# Patient Record
Sex: Female | Born: 1945
Health system: Southern US, Community
[De-identification: ages and names within clinical notes are randomized; demographics above are authoritative.]

## PROBLEM LIST (undated history)

## (undated) DIAGNOSIS — I10 Essential (primary) hypertension: Secondary | ICD-10-CM

## (undated) DIAGNOSIS — F419 Anxiety disorder, unspecified: Secondary | ICD-10-CM

## (undated) DIAGNOSIS — M1990 Unspecified osteoarthritis, unspecified site: Secondary | ICD-10-CM

## (undated) HISTORY — DX: Essential (primary) hypertension: I10

## (undated) HISTORY — DX: Unspecified osteoarthritis, unspecified site: M19.90

## (undated) HISTORY — DX: Anxiety disorder, unspecified: F41.9

---

## 1999-07-18 ENCOUNTER — Ambulatory Visit (HOSPITAL_BASED_OUTPATIENT_CLINIC_OR_DEPARTMENT_OTHER): Admission: RE | Admit: 1999-07-18 | Discharge: 1999-07-18 | Payer: Self-pay | Admitting: Orthopedic Surgery

## 1999-09-14 ENCOUNTER — Encounter: Admission: RE | Admit: 1999-09-14 | Discharge: 1999-09-14 | Payer: Self-pay | Admitting: *Deleted

## 1999-09-14 ENCOUNTER — Encounter: Payer: Self-pay | Admitting: *Deleted

## 2004-04-28 ENCOUNTER — Ambulatory Visit (HOSPITAL_COMMUNITY): Admission: RE | Admit: 2004-04-28 | Discharge: 2004-04-28 | Payer: Self-pay | Admitting: Gastroenterology

## 2007-06-11 ENCOUNTER — Encounter: Admission: RE | Admit: 2007-06-11 | Discharge: 2007-06-11 | Payer: Self-pay | Admitting: Family Medicine

## 2010-09-13 ENCOUNTER — Ambulatory Visit: Payer: Self-pay | Admitting: Oncology

## 2010-09-29 LAB — CBC WITH DIFFERENTIAL/PLATELET
BASO%: 1 % (ref 0.0–2.0)
Basophils Absolute: 0.1 10*3/uL (ref 0.0–0.1)
EOS%: 1 % (ref 0.0–7.0)
Eosinophils Absolute: 0.1 10*3/uL (ref 0.0–0.5)
HCT: 49.2 % — ABNORMAL HIGH (ref 34.8–46.6)
HGB: 16.3 g/dL — ABNORMAL HIGH (ref 11.6–15.9)
LYMPH%: 35.7 % (ref 14.0–49.7)
MCH: 29.9 pg (ref 25.1–34.0)
MCHC: 33.3 g/dL (ref 31.5–36.0)
MCV: 89.9 fL (ref 79.5–101.0)
MONO#: 0.6 10*3/uL (ref 0.1–0.9)
MONO%: 9.4 % (ref 0.0–14.0)
NEUT#: 3.1 10*3/uL (ref 1.5–6.5)
NEUT%: 52.9 % (ref 38.4–76.8)
Platelets: 220 10*3/uL (ref 145–400)
RBC: 5.46 10*6/uL — ABNORMAL HIGH (ref 3.70–5.45)
RDW: 13.1 % (ref 11.2–14.5)
WBC: 5.9 10*3/uL (ref 3.9–10.3)
lymph#: 2.1 10*3/uL (ref 0.9–3.3)

## 2010-09-29 LAB — CHCC SMEAR

## 2010-10-02 LAB — COMPREHENSIVE METABOLIC PANEL
ALT: 20 U/L (ref 0–35)
AST: 27 U/L (ref 0–37)
Albumin: 4.6 g/dL (ref 3.5–5.2)
Alkaline Phosphatase: 87 U/L (ref 39–117)
BUN: 13 mg/dL (ref 6–23)
CO2: 28 mEq/L (ref 19–32)
Calcium: 10.4 mg/dL (ref 8.4–10.5)
Chloride: 104 mEq/L (ref 96–112)
Creatinine, Ser: 0.75 mg/dL (ref 0.40–1.20)
Glucose, Bld: 94 mg/dL (ref 70–99)
Potassium: 4.4 mEq/L (ref 3.5–5.3)
Sodium: 143 mEq/L (ref 135–145)
Total Bilirubin: 1.1 mg/dL (ref 0.3–1.2)
Total Protein: 7.2 g/dL (ref 6.0–8.3)

## 2010-10-02 LAB — ERYTHROPOIETIN: Erythropoietin: 11.1 m[IU]/mL (ref 2.6–34.0)

## 2010-10-03 LAB — JAK-2 V617F

## 2010-11-05 ENCOUNTER — Encounter: Payer: Self-pay | Admitting: Family Medicine

## 2010-11-06 ENCOUNTER — Ambulatory Visit: Payer: Self-pay | Admitting: Oncology

## 2010-11-08 LAB — CBC WITH DIFFERENTIAL/PLATELET
BASO%: 1.1 % (ref 0.0–2.0)
Basophils Absolute: 0.1 10*3/uL (ref 0.0–0.1)
EOS%: 2.9 % (ref 0.0–7.0)
Eosinophils Absolute: 0.1 10*3/uL (ref 0.0–0.5)
HCT: 45.5 % (ref 34.8–46.6)
HGB: 15.5 g/dL (ref 11.6–15.9)
LYMPH%: 35 % (ref 14.0–49.7)
MCH: 30.3 pg (ref 25.1–34.0)
MCHC: 34.1 g/dL (ref 31.5–36.0)
MCV: 88.8 fL (ref 79.5–101.0)
MONO#: 0.6 10*3/uL (ref 0.1–0.9)
MONO%: 10.8 % (ref 0.0–14.0)
NEUT#: 2.6 10*3/uL (ref 1.5–6.5)
NEUT%: 50.2 % (ref 38.4–76.8)
Platelets: 182 10*3/uL (ref 145–400)
RBC: 5.12 10*6/uL (ref 3.70–5.45)
RDW: 13.3 % (ref 11.2–14.5)
WBC: 5.2 10*3/uL (ref 3.9–10.3)
lymph#: 1.8 10*3/uL (ref 0.9–3.3)

## 2011-05-01 ENCOUNTER — Other Ambulatory Visit: Payer: Self-pay | Admitting: Oncology

## 2011-05-01 ENCOUNTER — Encounter (HOSPITAL_BASED_OUTPATIENT_CLINIC_OR_DEPARTMENT_OTHER): Payer: BC Managed Care – PPO | Admitting: Oncology

## 2011-05-01 DIAGNOSIS — D751 Secondary polycythemia: Secondary | ICD-10-CM

## 2011-05-01 DIAGNOSIS — Z7982 Long term (current) use of aspirin: Secondary | ICD-10-CM

## 2011-05-01 LAB — CBC WITH DIFFERENTIAL/PLATELET
Eosinophils Absolute: 0.2 10*3/uL (ref 0.0–0.5)
MONO#: 0.6 10*3/uL (ref 0.1–0.9)
NEUT#: 3 10*3/uL (ref 1.5–6.5)
Platelets: 195 10*3/uL (ref 145–400)
RBC: 5.08 10*6/uL (ref 3.70–5.45)
RDW: 13.2 % (ref 11.2–14.5)
WBC: 6 10*3/uL (ref 3.9–10.3)

## 2012-05-26 DIAGNOSIS — M2020 Hallux rigidus, unspecified foot: Secondary | ICD-10-CM | POA: Diagnosis not present

## 2012-05-28 DIAGNOSIS — R7989 Other specified abnormal findings of blood chemistry: Secondary | ICD-10-CM | POA: Diagnosis not present

## 2012-05-29 DIAGNOSIS — H43819 Vitreous degeneration, unspecified eye: Secondary | ICD-10-CM | POA: Diagnosis not present

## 2012-05-29 DIAGNOSIS — R7989 Other specified abnormal findings of blood chemistry: Secondary | ICD-10-CM | POA: Diagnosis not present

## 2012-05-29 DIAGNOSIS — H52209 Unspecified astigmatism, unspecified eye: Secondary | ICD-10-CM | POA: Diagnosis not present

## 2012-05-29 DIAGNOSIS — H2640 Unspecified secondary cataract: Secondary | ICD-10-CM | POA: Diagnosis not present

## 2012-05-29 DIAGNOSIS — Z961 Presence of intraocular lens: Secondary | ICD-10-CM | POA: Diagnosis not present

## 2012-06-06 DIAGNOSIS — L918 Other hypertrophic disorders of the skin: Secondary | ICD-10-CM | POA: Diagnosis not present

## 2012-06-06 DIAGNOSIS — T8489XA Other specified complication of internal orthopedic prosthetic devices, implants and grafts, initial encounter: Secondary | ICD-10-CM | POA: Diagnosis not present

## 2012-06-06 DIAGNOSIS — M25579 Pain in unspecified ankle and joints of unspecified foot: Secondary | ICD-10-CM | POA: Diagnosis not present

## 2012-06-06 DIAGNOSIS — Z472 Encounter for removal of internal fixation device: Secondary | ICD-10-CM | POA: Diagnosis not present

## 2012-06-06 DIAGNOSIS — M2020 Hallux rigidus, unspecified foot: Secondary | ICD-10-CM | POA: Diagnosis not present

## 2012-06-06 DIAGNOSIS — M795 Residual foreign body in soft tissue: Secondary | ICD-10-CM | POA: Diagnosis not present

## 2012-06-26 DIAGNOSIS — H2640 Unspecified secondary cataract: Secondary | ICD-10-CM | POA: Diagnosis not present

## 2012-06-26 DIAGNOSIS — H26499 Other secondary cataract, unspecified eye: Secondary | ICD-10-CM | POA: Diagnosis not present

## 2012-06-30 DIAGNOSIS — M2020 Hallux rigidus, unspecified foot: Secondary | ICD-10-CM | POA: Diagnosis not present

## 2012-07-03 DIAGNOSIS — M2020 Hallux rigidus, unspecified foot: Secondary | ICD-10-CM | POA: Diagnosis not present

## 2012-07-08 DIAGNOSIS — M2020 Hallux rigidus, unspecified foot: Secondary | ICD-10-CM | POA: Diagnosis not present

## 2012-07-11 DIAGNOSIS — M2020 Hallux rigidus, unspecified foot: Secondary | ICD-10-CM | POA: Diagnosis not present

## 2012-07-15 DIAGNOSIS — M2020 Hallux rigidus, unspecified foot: Secondary | ICD-10-CM | POA: Diagnosis not present

## 2012-07-17 DIAGNOSIS — M2020 Hallux rigidus, unspecified foot: Secondary | ICD-10-CM | POA: Diagnosis not present

## 2012-07-25 DIAGNOSIS — Z23 Encounter for immunization: Secondary | ICD-10-CM | POA: Diagnosis not present

## 2012-09-02 DIAGNOSIS — Z1231 Encounter for screening mammogram for malignant neoplasm of breast: Secondary | ICD-10-CM | POA: Diagnosis not present

## 2012-09-03 DIAGNOSIS — R05 Cough: Secondary | ICD-10-CM | POA: Diagnosis not present

## 2012-09-03 DIAGNOSIS — H6120 Impacted cerumen, unspecified ear: Secondary | ICD-10-CM | POA: Diagnosis not present

## 2012-09-03 DIAGNOSIS — J309 Allergic rhinitis, unspecified: Secondary | ICD-10-CM | POA: Diagnosis not present

## 2012-09-18 DIAGNOSIS — Z1331 Encounter for screening for depression: Secondary | ICD-10-CM | POA: Diagnosis not present

## 2012-09-18 DIAGNOSIS — J309 Allergic rhinitis, unspecified: Secondary | ICD-10-CM | POA: Diagnosis not present

## 2012-09-18 DIAGNOSIS — E039 Hypothyroidism, unspecified: Secondary | ICD-10-CM | POA: Diagnosis not present

## 2012-09-18 DIAGNOSIS — Z803 Family history of malignant neoplasm of breast: Secondary | ICD-10-CM | POA: Diagnosis not present

## 2012-09-18 DIAGNOSIS — E7800 Pure hypercholesterolemia, unspecified: Secondary | ICD-10-CM | POA: Diagnosis not present

## 2012-09-18 DIAGNOSIS — F329 Major depressive disorder, single episode, unspecified: Secondary | ICD-10-CM | POA: Diagnosis not present

## 2012-09-18 DIAGNOSIS — I10 Essential (primary) hypertension: Secondary | ICD-10-CM | POA: Diagnosis not present

## 2012-09-18 DIAGNOSIS — Z0000 Encounter for general adult medical examination without abnormal findings: Secondary | ICD-10-CM | POA: Diagnosis not present

## 2013-06-03 DIAGNOSIS — H04129 Dry eye syndrome of unspecified lacrimal gland: Secondary | ICD-10-CM | POA: Diagnosis not present

## 2013-06-03 DIAGNOSIS — Z961 Presence of intraocular lens: Secondary | ICD-10-CM | POA: Diagnosis not present

## 2013-06-03 DIAGNOSIS — H43819 Vitreous degeneration, unspecified eye: Secondary | ICD-10-CM | POA: Diagnosis not present

## 2013-06-03 DIAGNOSIS — H2640 Unspecified secondary cataract: Secondary | ICD-10-CM | POA: Diagnosis not present

## 2013-08-06 DIAGNOSIS — Z23 Encounter for immunization: Secondary | ICD-10-CM | POA: Diagnosis not present

## 2013-09-08 DIAGNOSIS — Z1231 Encounter for screening mammogram for malignant neoplasm of breast: Secondary | ICD-10-CM | POA: Diagnosis not present

## 2013-10-01 DIAGNOSIS — J309 Allergic rhinitis, unspecified: Secondary | ICD-10-CM | POA: Diagnosis not present

## 2013-10-01 DIAGNOSIS — F329 Major depressive disorder, single episode, unspecified: Secondary | ICD-10-CM | POA: Diagnosis not present

## 2013-10-01 DIAGNOSIS — Z1331 Encounter for screening for depression: Secondary | ICD-10-CM | POA: Diagnosis not present

## 2013-10-01 DIAGNOSIS — E7800 Pure hypercholesterolemia, unspecified: Secondary | ICD-10-CM | POA: Diagnosis not present

## 2013-10-01 DIAGNOSIS — I10 Essential (primary) hypertension: Secondary | ICD-10-CM | POA: Diagnosis not present

## 2013-10-01 DIAGNOSIS — Z0000 Encounter for general adult medical examination without abnormal findings: Secondary | ICD-10-CM | POA: Diagnosis not present

## 2013-10-01 DIAGNOSIS — E039 Hypothyroidism, unspecified: Secondary | ICD-10-CM | POA: Diagnosis not present

## 2013-10-01 DIAGNOSIS — M2550 Pain in unspecified joint: Secondary | ICD-10-CM | POA: Diagnosis not present

## 2014-07-01 DIAGNOSIS — Z23 Encounter for immunization: Secondary | ICD-10-CM | POA: Diagnosis not present

## 2014-07-19 DIAGNOSIS — Z1211 Encounter for screening for malignant neoplasm of colon: Secondary | ICD-10-CM | POA: Diagnosis not present

## 2014-07-19 DIAGNOSIS — D120 Benign neoplasm of cecum: Secondary | ICD-10-CM | POA: Diagnosis not present

## 2014-07-19 DIAGNOSIS — K598 Other specified functional intestinal disorders: Secondary | ICD-10-CM | POA: Diagnosis not present

## 2014-07-19 DIAGNOSIS — K6289 Other specified diseases of anus and rectum: Secondary | ICD-10-CM | POA: Diagnosis not present

## 2014-07-23 DIAGNOSIS — H26491 Other secondary cataract, right eye: Secondary | ICD-10-CM | POA: Diagnosis not present

## 2014-07-23 DIAGNOSIS — Z961 Presence of intraocular lens: Secondary | ICD-10-CM | POA: Diagnosis not present

## 2014-07-23 DIAGNOSIS — H04123 Dry eye syndrome of bilateral lacrimal glands: Secondary | ICD-10-CM | POA: Diagnosis not present

## 2014-07-23 DIAGNOSIS — H43813 Vitreous degeneration, bilateral: Secondary | ICD-10-CM | POA: Diagnosis not present

## 2014-08-18 DIAGNOSIS — H6120 Impacted cerumen, unspecified ear: Secondary | ICD-10-CM | POA: Diagnosis not present

## 2014-08-18 DIAGNOSIS — J309 Allergic rhinitis, unspecified: Secondary | ICD-10-CM | POA: Diagnosis not present

## 2014-09-22 DIAGNOSIS — Z1231 Encounter for screening mammogram for malignant neoplasm of breast: Secondary | ICD-10-CM | POA: Diagnosis not present

## 2014-09-22 DIAGNOSIS — Z803 Family history of malignant neoplasm of breast: Secondary | ICD-10-CM | POA: Diagnosis not present

## 2014-10-22 DIAGNOSIS — Z13820 Encounter for screening for osteoporosis: Secondary | ICD-10-CM | POA: Diagnosis not present

## 2014-10-22 DIAGNOSIS — Z0000 Encounter for general adult medical examination without abnormal findings: Secondary | ICD-10-CM | POA: Diagnosis not present

## 2014-10-22 DIAGNOSIS — E039 Hypothyroidism, unspecified: Secondary | ICD-10-CM | POA: Diagnosis not present

## 2014-10-22 DIAGNOSIS — R8290 Unspecified abnormal findings in urine: Secondary | ICD-10-CM | POA: Diagnosis not present

## 2014-10-22 DIAGNOSIS — E780 Pure hypercholesterolemia: Secondary | ICD-10-CM | POA: Diagnosis not present

## 2014-10-22 DIAGNOSIS — Z23 Encounter for immunization: Secondary | ICD-10-CM | POA: Diagnosis not present

## 2014-10-22 DIAGNOSIS — F329 Major depressive disorder, single episode, unspecified: Secondary | ICD-10-CM | POA: Diagnosis not present

## 2014-10-22 DIAGNOSIS — J309 Allergic rhinitis, unspecified: Secondary | ICD-10-CM | POA: Diagnosis not present

## 2014-10-22 DIAGNOSIS — I10 Essential (primary) hypertension: Secondary | ICD-10-CM | POA: Diagnosis not present

## 2014-10-22 DIAGNOSIS — Z803 Family history of malignant neoplasm of breast: Secondary | ICD-10-CM | POA: Diagnosis not present

## 2014-12-01 DIAGNOSIS — M8580 Other specified disorders of bone density and structure, unspecified site: Secondary | ICD-10-CM | POA: Diagnosis not present

## 2014-12-01 DIAGNOSIS — M859 Disorder of bone density and structure, unspecified: Secondary | ICD-10-CM | POA: Diagnosis not present

## 2014-12-06 DIAGNOSIS — I10 Essential (primary) hypertension: Secondary | ICD-10-CM | POA: Diagnosis not present

## 2014-12-06 DIAGNOSIS — E780 Pure hypercholesterolemia: Secondary | ICD-10-CM | POA: Diagnosis not present

## 2014-12-06 DIAGNOSIS — Z13820 Encounter for screening for osteoporosis: Secondary | ICD-10-CM | POA: Diagnosis not present

## 2014-12-06 DIAGNOSIS — D582 Other hemoglobinopathies: Secondary | ICD-10-CM | POA: Diagnosis not present

## 2014-12-06 DIAGNOSIS — R8290 Unspecified abnormal findings in urine: Secondary | ICD-10-CM | POA: Diagnosis not present

## 2014-12-06 DIAGNOSIS — Z23 Encounter for immunization: Secondary | ICD-10-CM | POA: Diagnosis not present

## 2014-12-06 DIAGNOSIS — F329 Major depressive disorder, single episode, unspecified: Secondary | ICD-10-CM | POA: Diagnosis not present

## 2014-12-06 DIAGNOSIS — Z803 Family history of malignant neoplasm of breast: Secondary | ICD-10-CM | POA: Diagnosis not present

## 2014-12-06 DIAGNOSIS — E039 Hypothyroidism, unspecified: Secondary | ICD-10-CM | POA: Diagnosis not present

## 2014-12-06 DIAGNOSIS — J309 Allergic rhinitis, unspecified: Secondary | ICD-10-CM | POA: Diagnosis not present

## 2014-12-06 DIAGNOSIS — Z0000 Encounter for general adult medical examination without abnormal findings: Secondary | ICD-10-CM | POA: Diagnosis not present

## 2015-07-11 DIAGNOSIS — Z23 Encounter for immunization: Secondary | ICD-10-CM | POA: Diagnosis not present

## 2015-07-18 DIAGNOSIS — M1712 Unilateral primary osteoarthritis, left knee: Secondary | ICD-10-CM | POA: Diagnosis not present

## 2015-07-25 DIAGNOSIS — M1712 Unilateral primary osteoarthritis, left knee: Secondary | ICD-10-CM | POA: Diagnosis not present

## 2015-07-28 DIAGNOSIS — H26491 Other secondary cataract, right eye: Secondary | ICD-10-CM | POA: Diagnosis not present

## 2015-07-28 DIAGNOSIS — H43813 Vitreous degeneration, bilateral: Secondary | ICD-10-CM | POA: Diagnosis not present

## 2015-07-28 DIAGNOSIS — H52203 Unspecified astigmatism, bilateral: Secondary | ICD-10-CM | POA: Diagnosis not present

## 2015-07-28 DIAGNOSIS — Z961 Presence of intraocular lens: Secondary | ICD-10-CM | POA: Diagnosis not present

## 2015-08-01 DIAGNOSIS — M2342 Loose body in knee, left knee: Secondary | ICD-10-CM | POA: Diagnosis not present

## 2015-08-01 DIAGNOSIS — S83272A Complex tear of lateral meniscus, current injury, left knee, initial encounter: Secondary | ICD-10-CM | POA: Diagnosis not present

## 2015-08-01 DIAGNOSIS — M1712 Unilateral primary osteoarthritis, left knee: Secondary | ICD-10-CM | POA: Diagnosis not present

## 2015-10-12 DIAGNOSIS — Z803 Family history of malignant neoplasm of breast: Secondary | ICD-10-CM | POA: Diagnosis not present

## 2015-10-12 DIAGNOSIS — Z1231 Encounter for screening mammogram for malignant neoplasm of breast: Secondary | ICD-10-CM | POA: Diagnosis not present

## 2015-11-02 DIAGNOSIS — Z0000 Encounter for general adult medical examination without abnormal findings: Secondary | ICD-10-CM | POA: Diagnosis not present

## 2015-11-02 DIAGNOSIS — M8580 Other specified disorders of bone density and structure, unspecified site: Secondary | ICD-10-CM | POA: Diagnosis not present

## 2015-11-02 DIAGNOSIS — M899 Disorder of bone, unspecified: Secondary | ICD-10-CM | POA: Diagnosis not present

## 2015-11-02 DIAGNOSIS — F338 Other recurrent depressive disorders: Secondary | ICD-10-CM | POA: Diagnosis not present

## 2015-11-02 DIAGNOSIS — E7800 Pure hypercholesterolemia, unspecified: Secondary | ICD-10-CM | POA: Diagnosis not present

## 2015-11-02 DIAGNOSIS — Z803 Family history of malignant neoplasm of breast: Secondary | ICD-10-CM | POA: Diagnosis not present

## 2015-11-02 DIAGNOSIS — E039 Hypothyroidism, unspecified: Secondary | ICD-10-CM | POA: Diagnosis not present

## 2015-11-02 DIAGNOSIS — H6120 Impacted cerumen, unspecified ear: Secondary | ICD-10-CM | POA: Diagnosis not present

## 2015-11-02 DIAGNOSIS — I10 Essential (primary) hypertension: Secondary | ICD-10-CM | POA: Diagnosis not present

## 2015-11-03 DIAGNOSIS — H6120 Impacted cerumen, unspecified ear: Secondary | ICD-10-CM | POA: Diagnosis not present

## 2015-11-03 DIAGNOSIS — I10 Essential (primary) hypertension: Secondary | ICD-10-CM | POA: Diagnosis not present

## 2015-11-03 DIAGNOSIS — Z803 Family history of malignant neoplasm of breast: Secondary | ICD-10-CM | POA: Diagnosis not present

## 2015-11-03 DIAGNOSIS — E7800 Pure hypercholesterolemia, unspecified: Secondary | ICD-10-CM | POA: Diagnosis not present

## 2015-11-03 DIAGNOSIS — M899 Disorder of bone, unspecified: Secondary | ICD-10-CM | POA: Diagnosis not present

## 2015-11-03 DIAGNOSIS — M8580 Other specified disorders of bone density and structure, unspecified site: Secondary | ICD-10-CM | POA: Diagnosis not present

## 2015-11-03 DIAGNOSIS — Z0000 Encounter for general adult medical examination without abnormal findings: Secondary | ICD-10-CM | POA: Diagnosis not present

## 2015-11-03 DIAGNOSIS — F338 Other recurrent depressive disorders: Secondary | ICD-10-CM | POA: Diagnosis not present

## 2015-11-03 DIAGNOSIS — E039 Hypothyroidism, unspecified: Secondary | ICD-10-CM | POA: Diagnosis not present

## 2015-11-08 DIAGNOSIS — M659 Synovitis and tenosynovitis, unspecified: Secondary | ICD-10-CM | POA: Diagnosis not present

## 2015-11-08 DIAGNOSIS — G8918 Other acute postprocedural pain: Secondary | ICD-10-CM | POA: Diagnosis not present

## 2015-11-08 DIAGNOSIS — Y929 Unspecified place or not applicable: Secondary | ICD-10-CM | POA: Diagnosis not present

## 2015-11-08 DIAGNOSIS — M1712 Unilateral primary osteoarthritis, left knee: Secondary | ICD-10-CM | POA: Diagnosis not present

## 2015-11-08 DIAGNOSIS — Y999 Unspecified external cause status: Secondary | ICD-10-CM | POA: Diagnosis not present

## 2015-11-08 DIAGNOSIS — Y939 Activity, unspecified: Secondary | ICD-10-CM | POA: Diagnosis not present

## 2015-11-08 DIAGNOSIS — M94262 Chondromalacia, left knee: Secondary | ICD-10-CM | POA: Diagnosis not present

## 2015-11-08 DIAGNOSIS — S83272A Complex tear of lateral meniscus, current injury, left knee, initial encounter: Secondary | ICD-10-CM | POA: Diagnosis not present

## 2015-11-08 DIAGNOSIS — M6752 Plica syndrome, left knee: Secondary | ICD-10-CM | POA: Diagnosis not present

## 2015-11-08 DIAGNOSIS — M23352 Other meniscus derangements, posterior horn of lateral meniscus, left knee: Secondary | ICD-10-CM | POA: Diagnosis not present

## 2015-11-15 DIAGNOSIS — S83272D Complex tear of lateral meniscus, current injury, left knee, subsequent encounter: Secondary | ICD-10-CM | POA: Diagnosis not present

## 2015-11-15 DIAGNOSIS — M1712 Unilateral primary osteoarthritis, left knee: Secondary | ICD-10-CM | POA: Diagnosis not present

## 2015-11-15 DIAGNOSIS — Z4789 Encounter for other orthopedic aftercare: Secondary | ICD-10-CM | POA: Diagnosis not present

## 2015-11-18 DIAGNOSIS — S83272D Complex tear of lateral meniscus, current injury, left knee, subsequent encounter: Secondary | ICD-10-CM | POA: Diagnosis not present

## 2015-11-22 DIAGNOSIS — S83272D Complex tear of lateral meniscus, current injury, left knee, subsequent encounter: Secondary | ICD-10-CM | POA: Diagnosis not present

## 2015-11-25 DIAGNOSIS — S83272D Complex tear of lateral meniscus, current injury, left knee, subsequent encounter: Secondary | ICD-10-CM | POA: Diagnosis not present

## 2015-11-29 DIAGNOSIS — S83272D Complex tear of lateral meniscus, current injury, left knee, subsequent encounter: Secondary | ICD-10-CM | POA: Diagnosis not present

## 2015-12-02 DIAGNOSIS — S83272D Complex tear of lateral meniscus, current injury, left knee, subsequent encounter: Secondary | ICD-10-CM | POA: Diagnosis not present

## 2015-12-06 DIAGNOSIS — S83272D Complex tear of lateral meniscus, current injury, left knee, subsequent encounter: Secondary | ICD-10-CM | POA: Diagnosis not present

## 2015-12-09 DIAGNOSIS — S83272D Complex tear of lateral meniscus, current injury, left knee, subsequent encounter: Secondary | ICD-10-CM | POA: Diagnosis not present

## 2015-12-14 DIAGNOSIS — M19071 Primary osteoarthritis, right ankle and foot: Secondary | ICD-10-CM | POA: Diagnosis not present

## 2015-12-14 DIAGNOSIS — S83272D Complex tear of lateral meniscus, current injury, left knee, subsequent encounter: Secondary | ICD-10-CM | POA: Diagnosis not present

## 2015-12-14 DIAGNOSIS — M19072 Primary osteoarthritis, left ankle and foot: Secondary | ICD-10-CM | POA: Diagnosis not present

## 2015-12-14 DIAGNOSIS — M1712 Unilateral primary osteoarthritis, left knee: Secondary | ICD-10-CM | POA: Diagnosis not present

## 2016-01-02 DIAGNOSIS — J101 Influenza due to other identified influenza virus with other respiratory manifestations: Secondary | ICD-10-CM | POA: Diagnosis not present

## 2016-01-02 DIAGNOSIS — R509 Fever, unspecified: Secondary | ICD-10-CM | POA: Diagnosis not present

## 2016-02-01 DIAGNOSIS — M19072 Primary osteoarthritis, left ankle and foot: Secondary | ICD-10-CM | POA: Diagnosis not present

## 2016-02-01 DIAGNOSIS — M19071 Primary osteoarthritis, right ankle and foot: Secondary | ICD-10-CM | POA: Diagnosis not present

## 2016-04-25 DIAGNOSIS — L603 Nail dystrophy: Secondary | ICD-10-CM | POA: Diagnosis not present

## 2016-04-25 DIAGNOSIS — D239 Other benign neoplasm of skin, unspecified: Secondary | ICD-10-CM | POA: Diagnosis not present

## 2016-04-25 DIAGNOSIS — M7130 Other bursal cyst, unspecified site: Secondary | ICD-10-CM | POA: Diagnosis not present

## 2016-07-25 DIAGNOSIS — Z23 Encounter for immunization: Secondary | ICD-10-CM | POA: Diagnosis not present

## 2016-07-30 DIAGNOSIS — H52203 Unspecified astigmatism, bilateral: Secondary | ICD-10-CM | POA: Diagnosis not present

## 2016-07-30 DIAGNOSIS — H43813 Vitreous degeneration, bilateral: Secondary | ICD-10-CM | POA: Diagnosis not present

## 2016-07-30 DIAGNOSIS — Z961 Presence of intraocular lens: Secondary | ICD-10-CM | POA: Diagnosis not present

## 2016-07-30 DIAGNOSIS — H26491 Other secondary cataract, right eye: Secondary | ICD-10-CM | POA: Diagnosis not present

## 2016-10-22 DIAGNOSIS — Z1231 Encounter for screening mammogram for malignant neoplasm of breast: Secondary | ICD-10-CM | POA: Diagnosis not present

## 2016-10-22 DIAGNOSIS — Z803 Family history of malignant neoplasm of breast: Secondary | ICD-10-CM | POA: Diagnosis not present

## 2016-11-21 DIAGNOSIS — R8290 Unspecified abnormal findings in urine: Secondary | ICD-10-CM | POA: Diagnosis not present

## 2016-11-21 DIAGNOSIS — H6120 Impacted cerumen, unspecified ear: Secondary | ICD-10-CM | POA: Diagnosis not present

## 2016-11-21 DIAGNOSIS — Z803 Family history of malignant neoplasm of breast: Secondary | ICD-10-CM | POA: Diagnosis not present

## 2016-11-21 DIAGNOSIS — E039 Hypothyroidism, unspecified: Secondary | ICD-10-CM | POA: Diagnosis not present

## 2016-11-21 DIAGNOSIS — E7800 Pure hypercholesterolemia, unspecified: Secondary | ICD-10-CM | POA: Diagnosis not present

## 2016-11-21 DIAGNOSIS — M8580 Other specified disorders of bone density and structure, unspecified site: Secondary | ICD-10-CM | POA: Diagnosis not present

## 2016-11-21 DIAGNOSIS — M899 Disorder of bone, unspecified: Secondary | ICD-10-CM | POA: Diagnosis not present

## 2016-11-21 DIAGNOSIS — I10 Essential (primary) hypertension: Secondary | ICD-10-CM | POA: Diagnosis not present

## 2016-11-21 DIAGNOSIS — F338 Other recurrent depressive disorders: Secondary | ICD-10-CM | POA: Diagnosis not present

## 2016-11-21 DIAGNOSIS — Z0000 Encounter for general adult medical examination without abnormal findings: Secondary | ICD-10-CM | POA: Diagnosis not present

## 2016-11-26 ENCOUNTER — Telehealth: Payer: Self-pay | Admitting: Oncology

## 2016-11-26 ENCOUNTER — Encounter: Payer: Self-pay | Admitting: Oncology

## 2016-11-26 NOTE — Telephone Encounter (Signed)
Cld the pt to schedule an appt w/Dr. Alen Blew on 3/27 at 10am. Pt last saw Dr. Alen Blew in 2012. She agreed to the appt date and time. Demographics verified. Letter mailed.

## 2017-01-08 ENCOUNTER — Ambulatory Visit (HOSPITAL_BASED_OUTPATIENT_CLINIC_OR_DEPARTMENT_OTHER): Payer: Medicare Other | Admitting: Oncology

## 2017-01-08 ENCOUNTER — Telehealth: Payer: Self-pay | Admitting: Oncology

## 2017-01-08 VITALS — BP 143/63 | HR 81 | Temp 97.9°F | Resp 18 | Ht 66.0 in | Wt 149.4 lb

## 2017-01-08 DIAGNOSIS — D751 Secondary polycythemia: Secondary | ICD-10-CM

## 2017-01-08 DIAGNOSIS — Z7982 Long term (current) use of aspirin: Secondary | ICD-10-CM | POA: Diagnosis not present

## 2017-01-08 NOTE — Telephone Encounter (Signed)
Gave patient avs report and appointments for August. Date/time per pt - lab/fu updated from 10 am to 9 am per patient request. Also per patient request appointments scheduled for August.

## 2017-01-08 NOTE — Progress Notes (Signed)
Hematology and Oncology Follow Up Visit  SAESHA LLERENAS [id number] 1946/02/28 71 y.o. 01/08/2017 10:17 AM Gennette Pac, MDLittle, Lennette Bihari, MD   Principle Diagnosis: 71 year old woman with polycythemia likely due to secondary causes diagnosed in 2012. Her workup for polycythemia vera was unrevealing.   Current therapy: Observation and surveillance.  Interim History:  Ms. Blinn presents today for a follow-up visit. He is a pleasant woman assigned consultation in 2012 for the evaluation of polycythemia. Her hemoglobin at that time have ranged between normal range and slightly elevated around 16. In the last 6 years her hemoglobin have remained fluctuating around those ranges. Her most recent CBC in February 2018 showed a white cell count of 5.8, hemoglobin of 17.3 and a platelet count of 234. Her MCV was 89.4. She remains asymptomatic from these findings and her hemoglobin have not changed dramatically over the last 6 years. She denied any headaches, thrombosis or bleeding episodes. She denied any tobacco use but does report occasional alcohol use.  She does not report any headaches, blurry vision, syncope or seizures. She does not report any fevers or chills or sweats. She does not report any cough, wheezing or hemoptysis. She does not report any nausea, vomiting or abdominal pain. She does not report any frequency urgency or hesitancy. She does not report any skeletal complaints. Remaining review of systems unremarkable.  Medications: I have reviewed the patient's current medications.  No current outpatient prescriptions on file.   No current facility-administered medications for this visit.      Allergies: Allergies not on file  Past Medical History, Surgical history, Social history, and Family History were reviewed and updated.   Physical Exam: Vital he reviewed and appeared within normal range. ECOG: 0 General appearance: alert and cooperative Head: Normocephalic, without obvious  abnormality Neck: no adenopathy Lymph nodes: Cervical, supraclavicular, and axillary nodes normal. Heart:regular rate and rhythm, S1, S2 normal, no murmur, click, rub or gallop Lung:chest clear, no wheezing, rales, normal symmetric air entry Abdomin: soft, non-tender, without masses or organomegaly EXT:no erythema, induration, or nodules   Lab Results: Lab Results  Component Value Date   WBC 6.0 05/01/2011   HGB 15.8 05/01/2011   HCT 45.5 05/01/2011   MCV 89.7 05/01/2011   PLT 195 05/01/2011     Chemistry      Component Value Date/Time   NA 143 09/29/2010 1037   K 4.4 09/29/2010 1037   CL 104 09/29/2010 1037   CO2 28 09/29/2010 1037   BUN 13 09/29/2010 1037   CREATININE 0.75 09/29/2010 1037      Component Value Date/Time   CALCIUM 10.4 09/29/2010 1037   ALKPHOS 87 09/29/2010 1037   AST 27 09/29/2010 1037   ALT 20 09/29/2010 1037   BILITOT 1.1 09/29/2010 1037        Impression and Plan:   71 year old woman with the following issues:  1. Polycythemia dating back to 2012. Radiology appears to be secondary in nature without any evidence of a myeloproliferative disorder. The possibility of a polycythemia vera is low level is a consideration.  Her hemoglobin have ranged between 16 and 17 without any symptoms or thrombosis episodes. She does not have any constitutional symptoms. From a management standpoint, I have recommended continued observation and surveillance and I will repeat a myeloproliferative disorder workup at this time. I have recommended blood donation it's possible to keep her hemoglobin close to 16. Therapeutic phlebotomy can be utilized in the future if she develops any symptoms.  2. Thrombosis prophylaxis:  I have recommended continuing aspirin at 81 mg daily.  3. Follow-up: Will be in 4 months to repeat her hemoglobin and repeat her polycythemia workup.   [facility], MD 3/27/201810:17 AM

## 2017-05-23 ENCOUNTER — Ambulatory Visit (HOSPITAL_BASED_OUTPATIENT_CLINIC_OR_DEPARTMENT_OTHER): Payer: Medicare Other | Admitting: Oncology

## 2017-05-23 ENCOUNTER — Telehealth: Payer: Self-pay | Admitting: Oncology

## 2017-05-23 ENCOUNTER — Other Ambulatory Visit (HOSPITAL_BASED_OUTPATIENT_CLINIC_OR_DEPARTMENT_OTHER): Payer: Medicare Other

## 2017-05-23 VITALS — BP 141/67 | HR 67 | Temp 98.4°F | Resp 18 | Ht 66.0 in | Wt 152.3 lb

## 2017-05-23 DIAGNOSIS — D751 Secondary polycythemia: Secondary | ICD-10-CM

## 2017-05-23 LAB — CBC WITH DIFFERENTIAL/PLATELET
BASO%: 1.4 % (ref 0.0–2.0)
Basophils Absolute: 0.1 10*3/uL (ref 0.0–0.1)
EOS%: 2.4 % (ref 0.0–7.0)
Eosinophils Absolute: 0.2 10*3/uL (ref 0.0–0.5)
HEMATOCRIT: 49.4 % — AB (ref 34.8–46.6)
HGB: 16.7 g/dL — ABNORMAL HIGH (ref 11.6–15.9)
LYMPH#: 2.3 10*3/uL (ref 0.9–3.3)
LYMPH%: 36.6 % (ref 14.0–49.7)
MCH: 30.2 pg (ref 25.1–34.0)
MCHC: 33.8 g/dL (ref 31.5–36.0)
MCV: 89.4 fL (ref 79.5–101.0)
MONO#: 0.6 10*3/uL (ref 0.1–0.9)
MONO%: 9.1 % (ref 0.0–14.0)
NEUT#: 3.2 10*3/uL (ref 1.5–6.5)
NEUT%: 50.5 % (ref 38.4–76.8)
PLATELETS: 213 10*3/uL (ref 145–400)
RBC: 5.52 10*6/uL — ABNORMAL HIGH (ref 3.70–5.45)
RDW: 13.1 % (ref 11.2–14.5)
WBC: 6.3 10*3/uL (ref 3.9–10.3)

## 2017-05-23 NOTE — Progress Notes (Signed)
Hematology and Oncology Follow Up Visit  Brandi Mcconnell [id number] 02/10/1946 71 y.o. 05/23/2017 9:52 AM Little, Brandi Mcconnell MDLittle, Brandi Bihari, MD   Principle Diagnosis: 71 year old woman with polycythemia likely due to secondary causes diagnosed in 2012. Her workup for polycythemia vera was unrevealing.   Current therapy: Observation and surveillance.  Interim History:  Ms. Brandi Mcconnell presents today for a follow-up visit. Since the last visit, she continues to be in excellent health and shape. She denied any headaches, thrombosis or bleeding episodes. She denied any tobacco use but does report occasional alcohol use. She denied any sleep apnea symptoms including excessive snoring or episodes of apnea. She remains active and continues to attend to her activities of daily living. She is not taking any diuretics and denied any episodes of extreme dehydration.  She does not report any headaches, blurry vision, syncope or seizures. She does not report any fevers or chills or sweats. She does not report any cough, wheezing or hemoptysis. She does not report any nausea, vomiting or abdominal pain. She does not report any frequency urgency or hesitancy. She does not report any skeletal complaints. Remaining review of systems unremarkable.  Medications: I have reviewed the patient's current medications.  Current Outpatient Prescriptions  Medication Sig Dispense Refill  . amLODipine (NORVASC) 5 MG tablet Take 1 tablet by mouth daily.    Marland Kitchen aspirin EC 81 MG tablet Take 81 mg by mouth daily.    . Cholecalciferol (VITAMIN D) 2000 units CAPS Take 1 capsule by mouth daily.    Marland Kitchen escitalopram (LEXAPRO) 10 MG tablet Take 1 tablet by mouth daily.    . hydrOXYzine (ATARAX/VISTARIL) 10 MG tablet Take 1 tablet by mouth as directed.    Marland Kitchen levothyroxine (SYNTHROID, LEVOTHROID) 50 MCG tablet Take 1 tablet by mouth daily.    . Magnesium 500 MG CAPS Take 1 capsule by mouth daily.    . simvastatin (ZOCOR) 20 MG tablet Take 1 tablet by  mouth daily.     No current facility-administered medications for this visit.      Allergies: No Known Allergies  Past Medical History, Surgical history, Social history, and Family History were reviewed and updated.   Physical Exam: Blood pressure (!) 141/67, pulse 67, temperature 98.4 F (36.9 C), temperature source Oral, resp. rate 18, height 5\' 6"  (1.676 m), weight 152 lb 4.8 oz (69.1 kg), SpO2 99 %.   ECOG: 0 General appearance: alert and cooperative appeared without distress. Head: Normocephalic, without obvious abnormality no oral thrush or ulcers. Neck: no adenopathy Lymph nodes: Cervical, supraclavicular, and axillary nodes normal. Heart:regular rate and rhythm, S1, S2 normal, no murmur, click, rub or gallop Lung:chest clear, no wheezing, rales, normal symmetric air entry Abdomin: soft, non-tender, without masses or organomegaly EXT:no erythema, induration, or nodules   Lab Results: Lab Results  Component Value Date   WBC 6.3 05/23/2017   HGB 16.7 (H) 05/23/2017   HCT 49.4 (H) 05/23/2017   MCV 89.4 05/23/2017   PLT 213 05/23/2017     Chemistry      Component Value Date/Time   NA 143 09/29/2010 1037   K 4.4 09/29/2010 1037   CL 104 09/29/2010 1037   CO2 28 09/29/2010 1037   BUN 13 09/29/2010 1037   CREATININE 0.75 09/29/2010 1037      Component Value Date/Time   CALCIUM 10.4 09/29/2010 1037   ALKPHOS 87 09/29/2010 1037   AST 27 09/29/2010 1037   ALT 20 09/29/2010 1037   BILITOT 1.1 09/29/2010 1037  Impression and Plan:   70 year old woman with the following issues:  1. Polycythemia dating back to 2012 due to secondary causes. without any evidence of a myeloproliferative disorder. The possibility of a polycythemia vera is low l but continues to be on the differential.  Her hemoglobin have ranged between 16 and 17 without any symptoms or thrombosis episodes dating back to 2011. Given the chronicity of these findings I doubt we are dealing  with a myeloproliferative disorder.  Her hemoglobin continues to be under control and she is asymptomatic. I do not recommend any phlebotomy at this time but I recommended continued observation and surveillance.  2. Thrombosis prophylaxis: I have recommended continuing aspirin at 81 mg daily.  3. Follow-up: Will be in 6 months for a repeat CBC. She will have a repeat CBC done by Dr. Rex Kras and she'll follow-up with me after that to discuss whether she will need phlebotomy or not.   [facility], MD 8/9/20189:52 AM

## 2017-05-23 NOTE — Telephone Encounter (Signed)
Gave patient avs and calendar.   °

## 2017-06-19 DIAGNOSIS — Z23 Encounter for immunization: Secondary | ICD-10-CM | POA: Diagnosis not present

## 2017-08-05 DIAGNOSIS — H26491 Other secondary cataract, right eye: Secondary | ICD-10-CM | POA: Diagnosis not present

## 2017-08-05 DIAGNOSIS — H04123 Dry eye syndrome of bilateral lacrimal glands: Secondary | ICD-10-CM | POA: Diagnosis not present

## 2017-08-05 DIAGNOSIS — Z961 Presence of intraocular lens: Secondary | ICD-10-CM | POA: Diagnosis not present

## 2017-08-05 DIAGNOSIS — H52203 Unspecified astigmatism, bilateral: Secondary | ICD-10-CM | POA: Diagnosis not present

## 2017-08-08 DIAGNOSIS — M79662 Pain in left lower leg: Secondary | ICD-10-CM | POA: Diagnosis not present

## 2017-08-08 DIAGNOSIS — M79604 Pain in right leg: Secondary | ICD-10-CM | POA: Diagnosis not present

## 2017-08-12 DIAGNOSIS — M1711 Unilateral primary osteoarthritis, right knee: Secondary | ICD-10-CM | POA: Diagnosis not present

## 2017-12-10 DIAGNOSIS — I10 Essential (primary) hypertension: Secondary | ICD-10-CM | POA: Diagnosis not present

## 2017-12-11 DIAGNOSIS — E039 Hypothyroidism, unspecified: Secondary | ICD-10-CM | POA: Diagnosis not present

## 2017-12-11 DIAGNOSIS — M8580 Other specified disorders of bone density and structure, unspecified site: Secondary | ICD-10-CM | POA: Diagnosis not present

## 2017-12-11 DIAGNOSIS — I10 Essential (primary) hypertension: Secondary | ICD-10-CM | POA: Diagnosis not present

## 2017-12-11 DIAGNOSIS — R7301 Impaired fasting glucose: Secondary | ICD-10-CM | POA: Diagnosis not present

## 2017-12-11 DIAGNOSIS — F439 Reaction to severe stress, unspecified: Secondary | ICD-10-CM | POA: Diagnosis not present

## 2017-12-11 DIAGNOSIS — Z803 Family history of malignant neoplasm of breast: Secondary | ICD-10-CM | POA: Diagnosis not present

## 2017-12-11 DIAGNOSIS — D751 Secondary polycythemia: Secondary | ICD-10-CM | POA: Diagnosis not present

## 2017-12-11 DIAGNOSIS — F338 Other recurrent depressive disorders: Secondary | ICD-10-CM | POA: Diagnosis not present

## 2017-12-11 DIAGNOSIS — Z0000 Encounter for general adult medical examination without abnormal findings: Secondary | ICD-10-CM | POA: Diagnosis not present

## 2017-12-11 DIAGNOSIS — H6123 Impacted cerumen, bilateral: Secondary | ICD-10-CM | POA: Diagnosis not present

## 2017-12-11 DIAGNOSIS — E7800 Pure hypercholesterolemia, unspecified: Secondary | ICD-10-CM | POA: Diagnosis not present

## 2017-12-13 ENCOUNTER — Telehealth: Payer: Self-pay

## 2017-12-13 NOTE — Telephone Encounter (Signed)
Patient called to cancel appointment due hemoglobin level dropped from a 17 to 14.2. She will be going to reg practionist. Per 3/1 los

## 2017-12-17 DIAGNOSIS — Z803 Family history of malignant neoplasm of breast: Secondary | ICD-10-CM | POA: Diagnosis not present

## 2017-12-17 DIAGNOSIS — Z1231 Encounter for screening mammogram for malignant neoplasm of breast: Secondary | ICD-10-CM | POA: Diagnosis not present

## 2017-12-19 ENCOUNTER — Ambulatory Visit: Payer: Medicare Other | Admitting: Oncology

## 2018-02-14 DIAGNOSIS — R3915 Urgency of urination: Secondary | ICD-10-CM | POA: Diagnosis not present

## 2018-06-19 DIAGNOSIS — L259 Unspecified contact dermatitis, unspecified cause: Secondary | ICD-10-CM | POA: Diagnosis not present

## 2018-07-02 DIAGNOSIS — Z23 Encounter for immunization: Secondary | ICD-10-CM | POA: Diagnosis not present

## 2018-07-04 DIAGNOSIS — M7989 Other specified soft tissue disorders: Secondary | ICD-10-CM | POA: Diagnosis not present

## 2018-07-04 DIAGNOSIS — M79644 Pain in right finger(s): Secondary | ICD-10-CM | POA: Diagnosis not present

## 2018-08-18 DIAGNOSIS — R05 Cough: Secondary | ICD-10-CM | POA: Diagnosis not present

## 2018-08-19 DIAGNOSIS — L570 Actinic keratosis: Secondary | ICD-10-CM | POA: Diagnosis not present

## 2018-08-19 DIAGNOSIS — D229 Melanocytic nevi, unspecified: Secondary | ICD-10-CM | POA: Diagnosis not present

## 2018-08-19 DIAGNOSIS — L821 Other seborrheic keratosis: Secondary | ICD-10-CM | POA: Diagnosis not present

## 2018-08-27 DIAGNOSIS — Z961 Presence of intraocular lens: Secondary | ICD-10-CM | POA: Diagnosis not present

## 2018-08-27 DIAGNOSIS — H43813 Vitreous degeneration, bilateral: Secondary | ICD-10-CM | POA: Diagnosis not present

## 2018-08-27 DIAGNOSIS — H04123 Dry eye syndrome of bilateral lacrimal glands: Secondary | ICD-10-CM | POA: Diagnosis not present

## 2018-08-27 DIAGNOSIS — H26491 Other secondary cataract, right eye: Secondary | ICD-10-CM | POA: Diagnosis not present

## 2018-09-26 ENCOUNTER — Other Ambulatory Visit: Payer: Self-pay | Admitting: Family Medicine

## 2018-09-26 ENCOUNTER — Ambulatory Visit
Admission: RE | Admit: 2018-09-26 | Discharge: 2018-09-26 | Disposition: A | Discharge status: Home / Self Care | Payer: Medicare Other | Source: Ambulatory Visit | Attending: Family Medicine | Admitting: Family Medicine

## 2018-09-26 DIAGNOSIS — R059 Cough, unspecified: Secondary | ICD-10-CM

## 2018-09-26 DIAGNOSIS — R05 Cough: Secondary | ICD-10-CM

## 2018-10-21 DIAGNOSIS — J40 Bronchitis, not specified as acute or chronic: Secondary | ICD-10-CM | POA: Diagnosis not present

## 2018-10-21 DIAGNOSIS — H6123 Impacted cerumen, bilateral: Secondary | ICD-10-CM | POA: Diagnosis not present

## 2018-12-23 DIAGNOSIS — Z1231 Encounter for screening mammogram for malignant neoplasm of breast: Secondary | ICD-10-CM | POA: Diagnosis not present

## 2018-12-23 DIAGNOSIS — Z803 Family history of malignant neoplasm of breast: Secondary | ICD-10-CM | POA: Diagnosis not present

## 2019-01-21 DIAGNOSIS — I10 Essential (primary) hypertension: Secondary | ICD-10-CM | POA: Diagnosis not present

## 2019-01-21 DIAGNOSIS — Z862 Personal history of diseases of the blood and blood-forming organs and certain disorders involving the immune mechanism: Secondary | ICD-10-CM | POA: Diagnosis not present

## 2019-01-21 DIAGNOSIS — K5900 Constipation, unspecified: Secondary | ICD-10-CM | POA: Diagnosis not present

## 2019-01-21 DIAGNOSIS — Z0000 Encounter for general adult medical examination without abnormal findings: Secondary | ICD-10-CM | POA: Diagnosis not present

## 2019-01-21 DIAGNOSIS — H6123 Impacted cerumen, bilateral: Secondary | ICD-10-CM | POA: Diagnosis not present

## 2019-01-21 DIAGNOSIS — Z23 Encounter for immunization: Secondary | ICD-10-CM | POA: Diagnosis not present

## 2019-01-21 DIAGNOSIS — E039 Hypothyroidism, unspecified: Secondary | ICD-10-CM | POA: Diagnosis not present

## 2019-01-21 DIAGNOSIS — F419 Anxiety disorder, unspecified: Secondary | ICD-10-CM | POA: Diagnosis not present

## 2019-01-21 DIAGNOSIS — Z1159 Encounter for screening for other viral diseases: Secondary | ICD-10-CM | POA: Diagnosis not present

## 2019-01-21 DIAGNOSIS — F338 Other recurrent depressive disorders: Secondary | ICD-10-CM | POA: Diagnosis not present

## 2019-01-21 DIAGNOSIS — R7301 Impaired fasting glucose: Secondary | ICD-10-CM | POA: Diagnosis not present

## 2019-01-21 DIAGNOSIS — S41112A Laceration without foreign body of left upper arm, initial encounter: Secondary | ICD-10-CM | POA: Diagnosis not present

## 2019-01-21 DIAGNOSIS — Z803 Family history of malignant neoplasm of breast: Secondary | ICD-10-CM | POA: Diagnosis not present

## 2019-01-21 DIAGNOSIS — M8580 Other specified disorders of bone density and structure, unspecified site: Secondary | ICD-10-CM | POA: Diagnosis not present

## 2019-01-21 DIAGNOSIS — Z6824 Body mass index (BMI) 24.0-24.9, adult: Secondary | ICD-10-CM | POA: Diagnosis not present

## 2019-03-09 DIAGNOSIS — H01111 Allergic dermatitis of right upper eyelid: Secondary | ICD-10-CM | POA: Diagnosis not present

## 2019-03-09 DIAGNOSIS — H01112 Allergic dermatitis of right lower eyelid: Secondary | ICD-10-CM | POA: Diagnosis not present

## 2019-03-11 DIAGNOSIS — L237 Allergic contact dermatitis due to plants, except food: Secondary | ICD-10-CM | POA: Diagnosis not present

## 2019-06-30 DIAGNOSIS — Z23 Encounter for immunization: Secondary | ICD-10-CM | POA: Diagnosis not present

## 2019-07-16 ENCOUNTER — Other Ambulatory Visit: Payer: Self-pay

## 2019-07-16 DIAGNOSIS — Z20822 Contact with and (suspected) exposure to covid-19: Secondary | ICD-10-CM

## 2019-07-17 LAB — NOVEL CORONAVIRUS, NAA: SARS-CoV-2, NAA: NOT DETECTED

## 2019-11-04 ENCOUNTER — Ambulatory Visit: Payer: Medicare Other | Attending: Internal Medicine

## 2019-11-04 DIAGNOSIS — Z23 Encounter for immunization: Secondary | ICD-10-CM | POA: Diagnosis not present

## 2019-11-04 NOTE — Progress Notes (Signed)
   Covid-19 Vaccination Clinic  Name:  Brandi Mcconnell    MRN: [id number] DOB: 10-09-1946  11/04/2019  [name] was observed post Covid-19 immunization for 15 minutes without incidence. She was provided with Vaccine Information Sheet and instruction to access the V-Safe system.   [name] was instructed to call 911 with any severe reactions post vaccine: Marland Kitchen Difficulty breathing  . Swelling of your face and throat  . A fast heartbeat  . A bad rash all over your body  . Dizziness and weakness    Immunizations Administered    Name Date Dose VIS Date Route   Pfizer COVID-19 Vaccine 11/04/2019  9:50 AM 0.3 mL 09/25/2019 Intramuscular   Manufacturer: [employer], [employer]   Lot: [id number]   [place]: [id number]

## 2019-11-23 ENCOUNTER — Ambulatory Visit: Payer: Medicare Other | Attending: Internal Medicine

## 2019-11-23 DIAGNOSIS — Z23 Encounter for immunization: Secondary | ICD-10-CM | POA: Insufficient documentation

## 2019-11-23 NOTE — Progress Notes (Signed)
   Covid-19 Vaccination Clinic  Name:  EARLINE TANKS    MRN: [id number] DOB: 06-27-1946  11/23/2019  Ms. Smyer was observed post Covid-19 immunization for 15 minutes without incidence. She was provided with Vaccine Information Sheet and instruction to access the V-Safe system.   Ms. Secoy was instructed to call 911 with any severe reactions post vaccine: Marland Kitchen Difficulty breathing  . Swelling of your face and throat  . A fast heartbeat  . A bad rash all over your body  . Dizziness and weakness    Immunizations Administered    Name Date Dose VIS Date Route   Pfizer COVID-19 Vaccine 11/23/2019  1:38 PM 0.3 mL 09/25/2019 Intramuscular   Manufacturer: [place]   Lot: [id number]   [place]: [id number]

## 2019-11-29 ENCOUNTER — Ambulatory Visit: Payer: Medicare Other

## 2020-01-14 DIAGNOSIS — M461 Sacroiliitis, not elsewhere classified: Secondary | ICD-10-CM | POA: Diagnosis not present

## 2020-01-14 DIAGNOSIS — M19049 Primary osteoarthritis, unspecified hand: Secondary | ICD-10-CM | POA: Diagnosis not present

## 2020-01-14 DIAGNOSIS — M7630 Iliotibial band syndrome, unspecified leg: Secondary | ICD-10-CM | POA: Diagnosis not present

## 2020-01-14 DIAGNOSIS — M795 Residual foreign body in soft tissue: Secondary | ICD-10-CM | POA: Diagnosis not present

## 2020-01-26 DIAGNOSIS — Z1231 Encounter for screening mammogram for malignant neoplasm of breast: Secondary | ICD-10-CM | POA: Diagnosis not present

## 2020-02-09 DIAGNOSIS — M47896 Other spondylosis, lumbar region: Secondary | ICD-10-CM | POA: Diagnosis not present

## 2020-02-09 DIAGNOSIS — M545 Low back pain: Secondary | ICD-10-CM | POA: Diagnosis not present

## 2020-02-10 DIAGNOSIS — F419 Anxiety disorder, unspecified: Secondary | ICD-10-CM | POA: Diagnosis not present

## 2020-02-10 DIAGNOSIS — R7301 Impaired fasting glucose: Secondary | ICD-10-CM | POA: Diagnosis not present

## 2020-02-10 DIAGNOSIS — Z803 Family history of malignant neoplasm of breast: Secondary | ICD-10-CM | POA: Diagnosis not present

## 2020-02-10 DIAGNOSIS — E039 Hypothyroidism, unspecified: Secondary | ICD-10-CM | POA: Diagnosis not present

## 2020-02-10 DIAGNOSIS — Z800 Family history of malignant neoplasm of digestive organs: Secondary | ICD-10-CM | POA: Diagnosis not present

## 2020-02-10 DIAGNOSIS — D751 Secondary polycythemia: Secondary | ICD-10-CM | POA: Diagnosis not present

## 2020-02-10 DIAGNOSIS — E7800 Pure hypercholesterolemia, unspecified: Secondary | ICD-10-CM | POA: Diagnosis not present

## 2020-02-10 DIAGNOSIS — I10 Essential (primary) hypertension: Secondary | ICD-10-CM | POA: Diagnosis not present

## 2020-02-10 DIAGNOSIS — Z0000 Encounter for general adult medical examination without abnormal findings: Secondary | ICD-10-CM | POA: Diagnosis not present

## 2020-02-10 DIAGNOSIS — M8580 Other specified disorders of bone density and structure, unspecified site: Secondary | ICD-10-CM | POA: Diagnosis not present

## 2020-02-10 DIAGNOSIS — H6122 Impacted cerumen, left ear: Secondary | ICD-10-CM | POA: Diagnosis not present

## 2020-02-22 DIAGNOSIS — M5416 Radiculopathy, lumbar region: Secondary | ICD-10-CM | POA: Diagnosis not present

## 2020-02-25 DIAGNOSIS — M85852 Other specified disorders of bone density and structure, left thigh: Secondary | ICD-10-CM | POA: Diagnosis not present

## 2020-02-25 DIAGNOSIS — M85851 Other specified disorders of bone density and structure, right thigh: Secondary | ICD-10-CM | POA: Diagnosis not present

## 2020-02-25 DIAGNOSIS — M5416 Radiculopathy, lumbar region: Secondary | ICD-10-CM | POA: Diagnosis not present

## 2020-03-02 DIAGNOSIS — M5416 Radiculopathy, lumbar region: Secondary | ICD-10-CM | POA: Diagnosis not present

## 2020-03-04 DIAGNOSIS — M5416 Radiculopathy, lumbar region: Secondary | ICD-10-CM | POA: Diagnosis not present

## 2020-03-08 DIAGNOSIS — M5416 Radiculopathy, lumbar region: Secondary | ICD-10-CM | POA: Diagnosis not present

## 2020-03-11 DIAGNOSIS — M5416 Radiculopathy, lumbar region: Secondary | ICD-10-CM | POA: Diagnosis not present

## 2020-03-11 IMAGING — CR DG CHEST 2V
2 series · 2 of 2 positions shown · non-contrast
Comparison: Radiograph August 18, 2018.

CLINICAL DATA: Cough.

EXAM:
CHEST - 2 VIEW

[w chest pa]
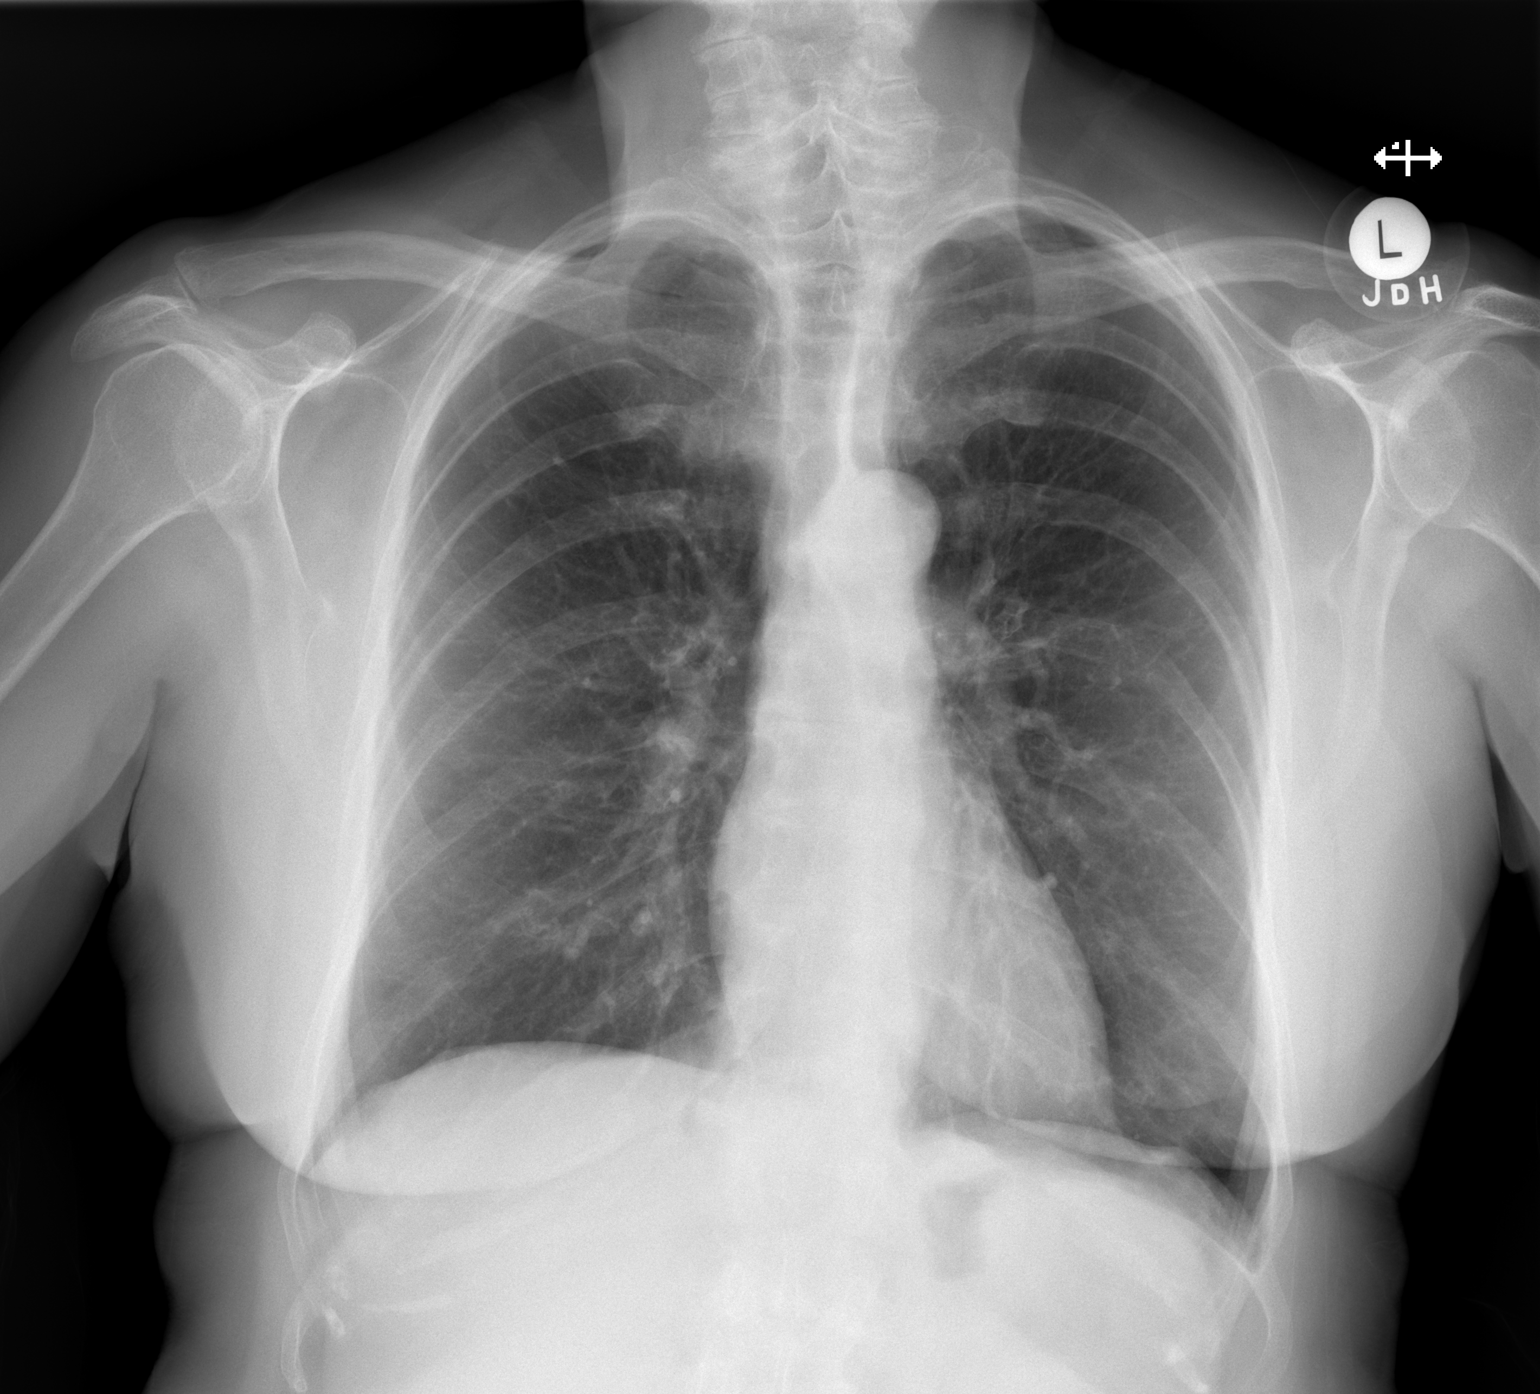

[w chest lat]
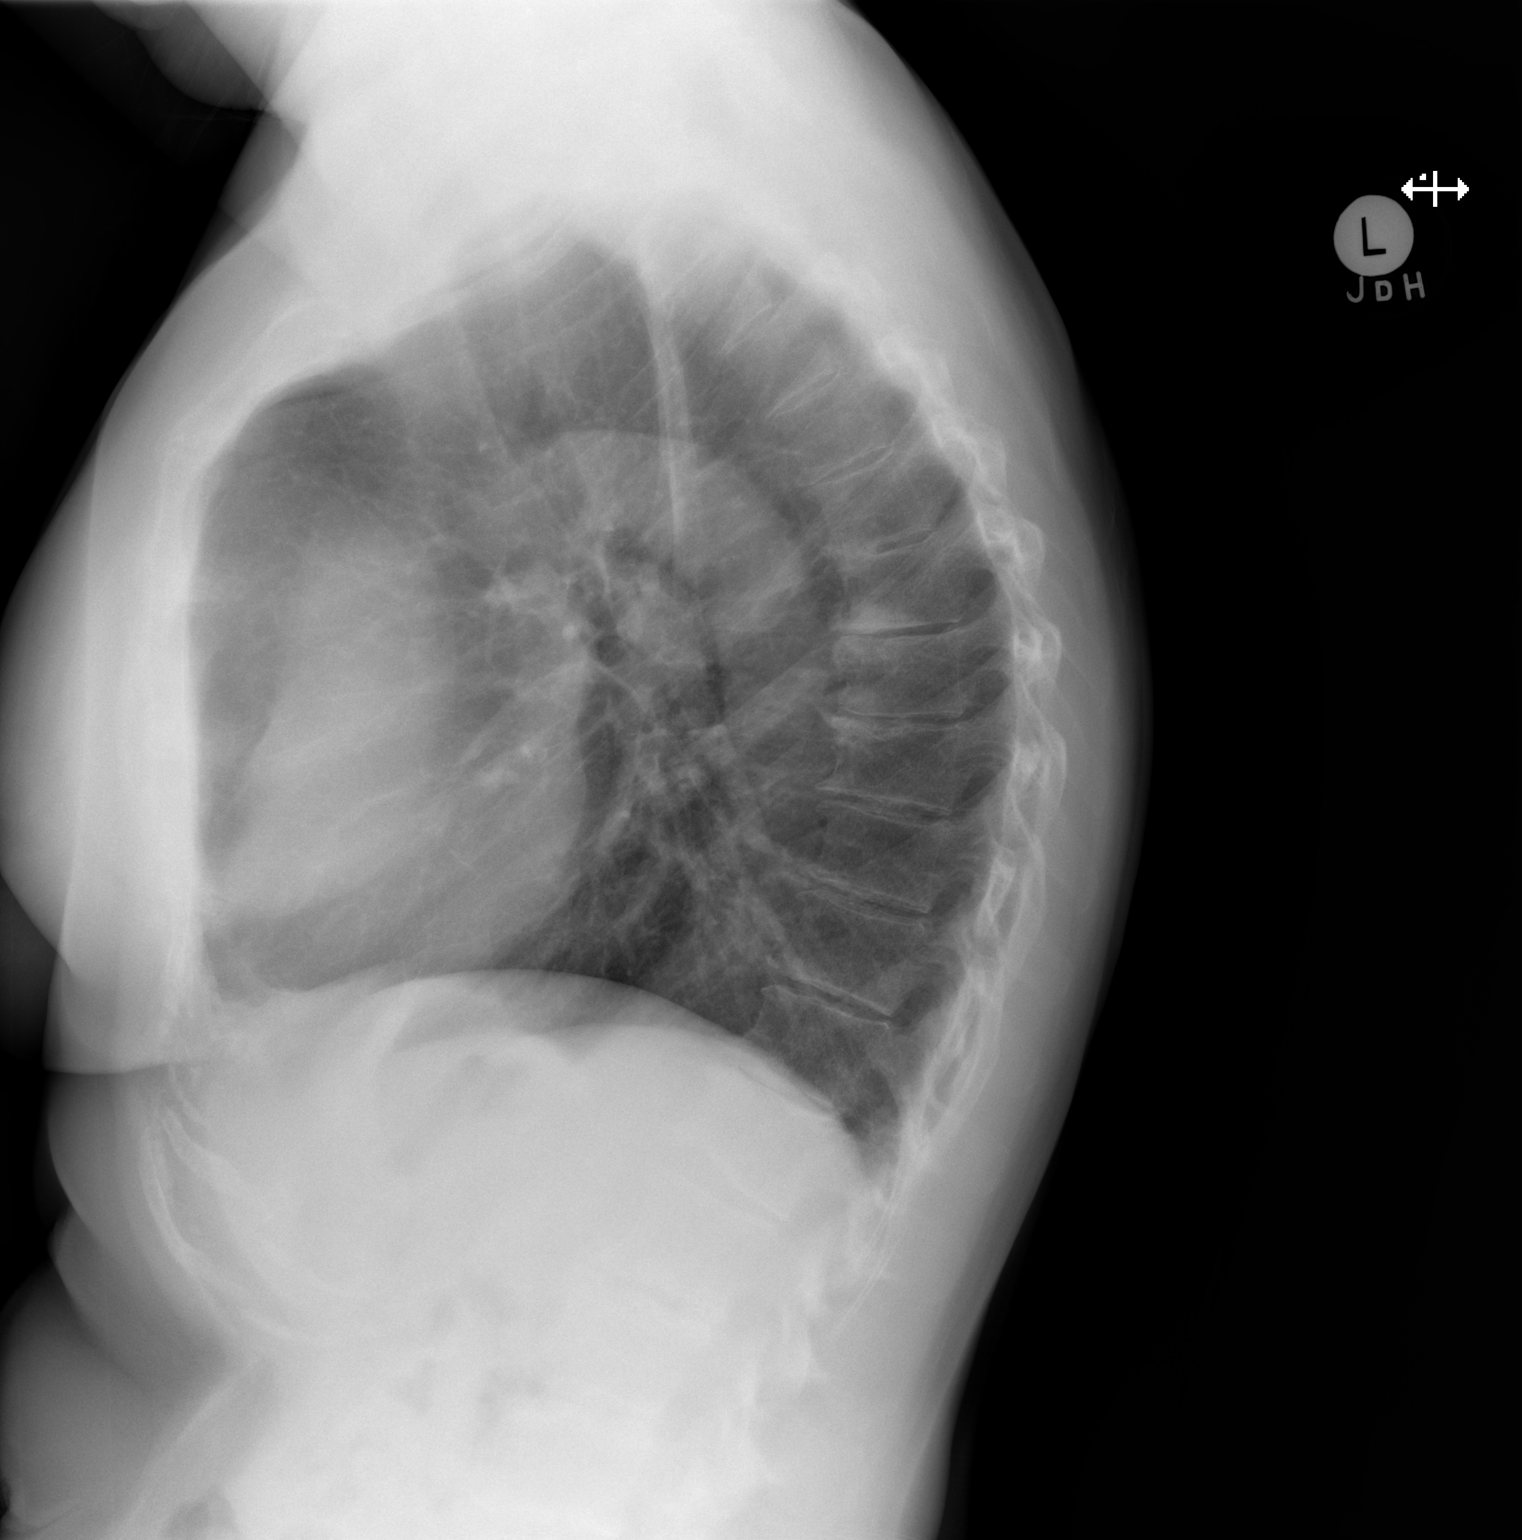

[2 of 2 positions shown; findings below may reference images not displayed]

FINDINGS: The heart size and mediastinal contours are within normal limits.
Both lungs are clear. The visualized skeletal structures are
unremarkable.
IMPRESSION: No active cardiopulmonary disease.

## 2020-03-15 DIAGNOSIS — M5416 Radiculopathy, lumbar region: Secondary | ICD-10-CM | POA: Diagnosis not present

## 2020-03-18 DIAGNOSIS — M5416 Radiculopathy, lumbar region: Secondary | ICD-10-CM | POA: Diagnosis not present

## 2020-03-31 DIAGNOSIS — M25561 Pain in right knee: Secondary | ICD-10-CM | POA: Diagnosis not present

## 2020-03-31 DIAGNOSIS — M25562 Pain in left knee: Secondary | ICD-10-CM | POA: Diagnosis not present

## 2020-03-31 DIAGNOSIS — M545 Low back pain: Secondary | ICD-10-CM | POA: Diagnosis not present

## 2020-03-31 DIAGNOSIS — M47896 Other spondylosis, lumbar region: Secondary | ICD-10-CM | POA: Diagnosis not present

## 2020-05-19 DIAGNOSIS — R2231 Localized swelling, mass and lump, right upper limb: Secondary | ICD-10-CM | POA: Diagnosis not present

## 2020-05-19 DIAGNOSIS — M79642 Pain in left hand: Secondary | ICD-10-CM | POA: Diagnosis not present

## 2020-05-19 DIAGNOSIS — M79641 Pain in right hand: Secondary | ICD-10-CM | POA: Diagnosis not present

## 2020-05-19 DIAGNOSIS — M13849 Other specified arthritis, unspecified hand: Secondary | ICD-10-CM | POA: Diagnosis not present

## 2020-07-13 DIAGNOSIS — Z23 Encounter for immunization: Secondary | ICD-10-CM | POA: Diagnosis not present

## 2020-07-29 DIAGNOSIS — Z23 Encounter for immunization: Secondary | ICD-10-CM | POA: Diagnosis not present

## 2020-11-09 ENCOUNTER — Encounter: Payer: Self-pay | Admitting: Physician Assistant

## 2020-11-09 ENCOUNTER — Ambulatory Visit (INDEPENDENT_AMBULATORY_CARE_PROVIDER_SITE_OTHER): Payer: Medicare Other | Admitting: Physician Assistant

## 2020-11-09 ENCOUNTER — Other Ambulatory Visit: Payer: Self-pay

## 2020-11-09 DIAGNOSIS — L608 Other nail disorders: Secondary | ICD-10-CM

## 2020-11-09 DIAGNOSIS — H26491 Other secondary cataract, right eye: Secondary | ICD-10-CM | POA: Diagnosis not present

## 2020-11-09 DIAGNOSIS — Z1283 Encounter for screening for malignant neoplasm of skin: Secondary | ICD-10-CM

## 2020-11-09 DIAGNOSIS — D1801 Hemangioma of skin and subcutaneous tissue: Secondary | ICD-10-CM

## 2020-11-09 DIAGNOSIS — H04123 Dry eye syndrome of bilateral lacrimal glands: Secondary | ICD-10-CM | POA: Diagnosis not present

## 2020-11-09 DIAGNOSIS — H43813 Vitreous degeneration, bilateral: Secondary | ICD-10-CM | POA: Diagnosis not present

## 2020-11-09 DIAGNOSIS — H52203 Unspecified astigmatism, bilateral: Secondary | ICD-10-CM | POA: Diagnosis not present

## 2020-11-10 ENCOUNTER — Ambulatory Visit: Payer: Medicare Other | Admitting: Physician Assistant

## 2020-11-18 DIAGNOSIS — Z01812 Encounter for preprocedural laboratory examination: Secondary | ICD-10-CM | POA: Diagnosis not present

## 2020-11-23 ENCOUNTER — Encounter: Payer: Self-pay | Admitting: Physician Assistant

## 2020-11-23 DIAGNOSIS — Z800 Family history of malignant neoplasm of digestive organs: Secondary | ICD-10-CM | POA: Diagnosis not present

## 2020-11-23 DIAGNOSIS — D128 Benign neoplasm of rectum: Secondary | ICD-10-CM | POA: Diagnosis not present

## 2020-11-23 NOTE — Progress Notes (Signed)
   New Patient   Subjective  Brandi Mcconnell is a 75 y.o. female who presents for the following: Annual Exam (No new concerns).   The following portions of the chart were reviewed this encounter and updated as appropriate:  Allergies  Meds  Problems      Objective  Well appearing patient in no apparent distress; mood and affect are within normal limits.  A full examination was performed including scalp, head, eyes, ears, nose, lips, neck, chest, axillae, abdomen, back, buttocks, bilateral upper extremities, bilateral lower extremities, hands, feet, fingers, toes, fingernails, and toenails. All findings within normal limits unless otherwise noted below.  Objective  Head to toe: Full body skin examination- clear no signs of atypical moles, melanoma or non mole skin cancer.  Objective  Mid Back: Red papule  Objective  Right Hallux Toe Nail Plate: Per patient this nail has been dark x 40 years, she's had it looked at years ago by another Doctor and was told it's okay.  Images     Assessment & Plan  Encounter for screening for malignant neoplasm of skin Head to toe  Yearly skin check  Hemangioma of skin Mid Back  observe  Discoloration of nail Right Hallux Toe Nail Plate  observe     I,Eryx Zane,acting as a scribe for Cydni Reddoch, PA-C.,have documented all relevant documentation on the behalf of [place], PA-C,as directed by  [facility], PA-C while in the presence of Antar Milks, PA-C.

## 2020-11-25 DIAGNOSIS — D128 Benign neoplasm of rectum: Secondary | ICD-10-CM | POA: Diagnosis not present

## 2021-01-31 DIAGNOSIS — Z1231 Encounter for screening mammogram for malignant neoplasm of breast: Secondary | ICD-10-CM | POA: Diagnosis not present

## 2021-02-06 DIAGNOSIS — Z23 Encounter for immunization: Secondary | ICD-10-CM | POA: Diagnosis not present

## 2021-02-27 DIAGNOSIS — I10 Essential (primary) hypertension: Secondary | ICD-10-CM | POA: Diagnosis not present

## 2021-02-27 DIAGNOSIS — R7301 Impaired fasting glucose: Secondary | ICD-10-CM | POA: Diagnosis not present

## 2021-02-27 DIAGNOSIS — F419 Anxiety disorder, unspecified: Secondary | ICD-10-CM | POA: Diagnosis not present

## 2021-02-27 DIAGNOSIS — E7800 Pure hypercholesterolemia, unspecified: Secondary | ICD-10-CM | POA: Diagnosis not present

## 2021-02-27 DIAGNOSIS — D751 Secondary polycythemia: Secondary | ICD-10-CM | POA: Diagnosis not present

## 2021-02-27 DIAGNOSIS — M8580 Other specified disorders of bone density and structure, unspecified site: Secondary | ICD-10-CM | POA: Diagnosis not present

## 2021-02-27 DIAGNOSIS — E039 Hypothyroidism, unspecified: Secondary | ICD-10-CM | POA: Diagnosis not present

## 2021-02-28 DIAGNOSIS — Z1389 Encounter for screening for other disorder: Secondary | ICD-10-CM | POA: Diagnosis not present

## 2021-02-28 DIAGNOSIS — Z0000 Encounter for general adult medical examination without abnormal findings: Secondary | ICD-10-CM | POA: Diagnosis not present

## 2021-04-20 DIAGNOSIS — M25561 Pain in right knee: Secondary | ICD-10-CM | POA: Diagnosis not present

## 2021-05-11 DIAGNOSIS — M25561 Pain in right knee: Secondary | ICD-10-CM | POA: Diagnosis not present

## 2021-05-11 DIAGNOSIS — M1711 Unilateral primary osteoarthritis, right knee: Secondary | ICD-10-CM | POA: Diagnosis not present

## 2021-05-12 DIAGNOSIS — U071 COVID-19: Secondary | ICD-10-CM | POA: Diagnosis not present

## 2021-05-29 DIAGNOSIS — M25561 Pain in right knee: Secondary | ICD-10-CM | POA: Diagnosis not present

## 2021-06-05 DIAGNOSIS — S83241A Other tear of medial meniscus, current injury, right knee, initial encounter: Secondary | ICD-10-CM | POA: Diagnosis not present

## 2021-06-05 DIAGNOSIS — S83511A Sprain of anterior cruciate ligament of right knee, initial encounter: Secondary | ICD-10-CM | POA: Diagnosis not present

## 2021-06-05 DIAGNOSIS — S83281A Other tear of lateral meniscus, current injury, right knee, initial encounter: Secondary | ICD-10-CM | POA: Diagnosis not present

## 2021-06-15 DIAGNOSIS — S83271A Complex tear of lateral meniscus, current injury, right knee, initial encounter: Secondary | ICD-10-CM | POA: Diagnosis not present

## 2021-06-15 DIAGNOSIS — M2341 Loose body in knee, right knee: Secondary | ICD-10-CM | POA: Diagnosis not present

## 2021-06-15 DIAGNOSIS — X58XXXA Exposure to other specified factors, initial encounter: Secondary | ICD-10-CM | POA: Diagnosis not present

## 2021-06-15 DIAGNOSIS — M6751 Plica syndrome, right knee: Secondary | ICD-10-CM | POA: Diagnosis not present

## 2021-06-15 DIAGNOSIS — M94261 Chondromalacia, right knee: Secondary | ICD-10-CM | POA: Diagnosis not present

## 2021-06-15 DIAGNOSIS — Y999 Unspecified external cause status: Secondary | ICD-10-CM | POA: Diagnosis not present

## 2021-06-15 DIAGNOSIS — G8918 Other acute postprocedural pain: Secondary | ICD-10-CM | POA: Diagnosis not present

## 2021-06-22 DIAGNOSIS — M25561 Pain in right knee: Secondary | ICD-10-CM | POA: Diagnosis not present

## 2021-06-26 DIAGNOSIS — M25561 Pain in right knee: Secondary | ICD-10-CM | POA: Diagnosis not present

## 2021-06-28 DIAGNOSIS — M25561 Pain in right knee: Secondary | ICD-10-CM | POA: Diagnosis not present

## 2021-07-03 DIAGNOSIS — M25561 Pain in right knee: Secondary | ICD-10-CM | POA: Diagnosis not present

## 2021-07-06 DIAGNOSIS — M25561 Pain in right knee: Secondary | ICD-10-CM | POA: Diagnosis not present

## 2021-07-11 DIAGNOSIS — M25561 Pain in right knee: Secondary | ICD-10-CM | POA: Diagnosis not present

## 2021-07-13 DIAGNOSIS — M25561 Pain in right knee: Secondary | ICD-10-CM | POA: Diagnosis not present

## 2021-07-19 DIAGNOSIS — Z23 Encounter for immunization: Secondary | ICD-10-CM | POA: Diagnosis not present

## 2021-09-02 DIAGNOSIS — Z23 Encounter for immunization: Secondary | ICD-10-CM | POA: Diagnosis not present

## 2021-09-29 DIAGNOSIS — M1711 Unilateral primary osteoarthritis, right knee: Secondary | ICD-10-CM | POA: Diagnosis not present

## 2021-10-05 DIAGNOSIS — M1711 Unilateral primary osteoarthritis, right knee: Secondary | ICD-10-CM | POA: Diagnosis not present

## 2021-10-12 DIAGNOSIS — M1711 Unilateral primary osteoarthritis, right knee: Secondary | ICD-10-CM | POA: Diagnosis not present

## 2021-11-14 DIAGNOSIS — H43813 Vitreous degeneration, bilateral: Secondary | ICD-10-CM | POA: Diagnosis not present

## 2021-11-14 DIAGNOSIS — H532 Diplopia: Secondary | ICD-10-CM | POA: Diagnosis not present

## 2021-11-14 DIAGNOSIS — H52203 Unspecified astigmatism, bilateral: Secondary | ICD-10-CM | POA: Diagnosis not present

## 2021-11-22 DIAGNOSIS — M1711 Unilateral primary osteoarthritis, right knee: Secondary | ICD-10-CM | POA: Diagnosis not present

## 2021-12-05 DIAGNOSIS — H532 Diplopia: Secondary | ICD-10-CM | POA: Diagnosis not present

## 2022-02-20 DIAGNOSIS — Z1231 Encounter for screening mammogram for malignant neoplasm of breast: Secondary | ICD-10-CM | POA: Diagnosis not present

## 2022-03-01 DIAGNOSIS — E039 Hypothyroidism, unspecified: Secondary | ICD-10-CM | POA: Diagnosis not present

## 2022-03-01 DIAGNOSIS — Z0000 Encounter for general adult medical examination without abnormal findings: Secondary | ICD-10-CM | POA: Diagnosis not present

## 2022-03-01 DIAGNOSIS — Z1389 Encounter for screening for other disorder: Secondary | ICD-10-CM | POA: Diagnosis not present

## 2022-03-01 DIAGNOSIS — E7800 Pure hypercholesterolemia, unspecified: Secondary | ICD-10-CM | POA: Diagnosis not present

## 2022-03-01 DIAGNOSIS — I10 Essential (primary) hypertension: Secondary | ICD-10-CM | POA: Diagnosis not present

## 2022-03-01 DIAGNOSIS — R7301 Impaired fasting glucose: Secondary | ICD-10-CM | POA: Diagnosis not present

## 2022-03-15 DIAGNOSIS — M8580 Other specified disorders of bone density and structure, unspecified site: Secondary | ICD-10-CM | POA: Diagnosis not present

## 2022-03-15 DIAGNOSIS — F329 Major depressive disorder, single episode, unspecified: Secondary | ICD-10-CM | POA: Diagnosis not present

## 2022-03-15 DIAGNOSIS — E039 Hypothyroidism, unspecified: Secondary | ICD-10-CM | POA: Diagnosis not present

## 2022-03-15 DIAGNOSIS — I10 Essential (primary) hypertension: Secondary | ICD-10-CM | POA: Diagnosis not present

## 2022-03-15 DIAGNOSIS — R7301 Impaired fasting glucose: Secondary | ICD-10-CM | POA: Diagnosis not present

## 2022-03-15 DIAGNOSIS — E7800 Pure hypercholesterolemia, unspecified: Secondary | ICD-10-CM | POA: Diagnosis not present

## 2022-03-20 ENCOUNTER — Ambulatory Visit (INDEPENDENT_AMBULATORY_CARE_PROVIDER_SITE_OTHER): Payer: Medicare Other | Admitting: Physician Assistant

## 2022-03-20 DIAGNOSIS — Z1283 Encounter for screening for malignant neoplasm of skin: Secondary | ICD-10-CM | POA: Diagnosis not present

## 2022-03-20 NOTE — Patient Instructions (Signed)
  Over the counter- DermaNail

## 2022-03-26 ENCOUNTER — Encounter: Payer: Self-pay | Admitting: Physician Assistant

## 2022-03-26 NOTE — Progress Notes (Signed)
   Follow-Up Visit   Subjective  Brandi Mcconnell is a 76 y.o. female who presents for the following: Annual Exam (Patient here today for skin check, per patient her fingernails are ridged and weak patient states that she used some polish that was helping. Recheck lesions on her abdomen. No personal history or family history of atypical moles, melanoma or non mole skin cancer. ).   The following portions of the chart were reviewed this encounter and updated as appropriate:  Tobacco  Allergies  Meds  Problems  Med Hx  Surg Hx  Fam Hx      Objective  Well appearing patient in no apparent distress; mood and affect are within normal limits.  A full examination was performed including scalp, head, eyes, ears, nose, lips, neck, chest, axillae, abdomen, back, buttocks, bilateral upper extremities, bilateral lower extremities, hands, feet, fingers, toes, fingernails, and toenails. All findings within normal limits unless otherwise noted below.  Full body skin examination- No atypical nevi or signs of NMSC noted at the time of the visit.    Assessment & Plan  Encounter for screening for malignant neoplasm of skin  Yearly skin examination    I, Jeneen Doutt, PA-C, have reviewed all documentation's for this visit.  The documentation on 03/26/22 for the exam, diagnosis, procedures and orders are all accurate and complete.

## 2022-04-05 DIAGNOSIS — Z780 Asymptomatic menopausal state: Secondary | ICD-10-CM | POA: Diagnosis not present

## 2022-04-05 DIAGNOSIS — M85851 Other specified disorders of bone density and structure, right thigh: Secondary | ICD-10-CM | POA: Diagnosis not present

## 2022-04-05 DIAGNOSIS — M85852 Other specified disorders of bone density and structure, left thigh: Secondary | ICD-10-CM | POA: Diagnosis not present

## 2022-05-27 DIAGNOSIS — Z23 Encounter for immunization: Secondary | ICD-10-CM | POA: Diagnosis not present

## 2022-07-12 DIAGNOSIS — Z23 Encounter for immunization: Secondary | ICD-10-CM | POA: Diagnosis not present

## 2022-12-13 DIAGNOSIS — H52203 Unspecified astigmatism, bilateral: Secondary | ICD-10-CM | POA: Diagnosis not present

## 2022-12-13 DIAGNOSIS — H532 Diplopia: Secondary | ICD-10-CM | POA: Diagnosis not present

## 2022-12-13 DIAGNOSIS — H26491 Other secondary cataract, right eye: Secondary | ICD-10-CM | POA: Diagnosis not present

## 2023-02-26 DIAGNOSIS — Z1231 Encounter for screening mammogram for malignant neoplasm of breast: Secondary | ICD-10-CM | POA: Diagnosis not present

## 2023-03-05 DIAGNOSIS — I10 Essential (primary) hypertension: Secondary | ICD-10-CM | POA: Diagnosis not present

## 2023-03-05 DIAGNOSIS — E039 Hypothyroidism, unspecified: Secondary | ICD-10-CM | POA: Diagnosis not present

## 2023-03-05 DIAGNOSIS — R7301 Impaired fasting glucose: Secondary | ICD-10-CM | POA: Diagnosis not present

## 2023-03-05 DIAGNOSIS — Z0000 Encounter for general adult medical examination without abnormal findings: Secondary | ICD-10-CM | POA: Diagnosis not present

## 2023-03-05 DIAGNOSIS — Z1389 Encounter for screening for other disorder: Secondary | ICD-10-CM | POA: Diagnosis not present

## 2023-03-05 DIAGNOSIS — E7800 Pure hypercholesterolemia, unspecified: Secondary | ICD-10-CM | POA: Diagnosis not present

## 2023-03-05 DIAGNOSIS — Z6823 Body mass index (BMI) 23.0-23.9, adult: Secondary | ICD-10-CM | POA: Diagnosis not present

## 2023-03-06 DIAGNOSIS — E039 Hypothyroidism, unspecified: Secondary | ICD-10-CM | POA: Diagnosis not present

## 2023-03-06 DIAGNOSIS — Z6824 Body mass index (BMI) 24.0-24.9, adult: Secondary | ICD-10-CM | POA: Diagnosis not present

## 2023-03-06 DIAGNOSIS — M8580 Other specified disorders of bone density and structure, unspecified site: Secondary | ICD-10-CM | POA: Diagnosis not present

## 2023-03-06 DIAGNOSIS — E7800 Pure hypercholesterolemia, unspecified: Secondary | ICD-10-CM | POA: Diagnosis not present

## 2023-03-06 DIAGNOSIS — R7301 Impaired fasting glucose: Secondary | ICD-10-CM | POA: Diagnosis not present

## 2023-03-06 DIAGNOSIS — F329 Major depressive disorder, single episode, unspecified: Secondary | ICD-10-CM | POA: Diagnosis not present

## 2023-03-06 DIAGNOSIS — I10 Essential (primary) hypertension: Secondary | ICD-10-CM | POA: Diagnosis not present

## 2023-03-21 DIAGNOSIS — I10 Essential (primary) hypertension: Secondary | ICD-10-CM | POA: Diagnosis not present

## 2023-07-01 DIAGNOSIS — Z23 Encounter for immunization: Secondary | ICD-10-CM | POA: Diagnosis not present

## 2023-07-09 DIAGNOSIS — L821 Other seborrheic keratosis: Secondary | ICD-10-CM | POA: Diagnosis not present

## 2023-07-09 DIAGNOSIS — L578 Other skin changes due to chronic exposure to nonionizing radiation: Secondary | ICD-10-CM | POA: Diagnosis not present

## 2023-07-09 DIAGNOSIS — L814 Other melanin hyperpigmentation: Secondary | ICD-10-CM | POA: Diagnosis not present

## 2023-07-09 DIAGNOSIS — D1801 Hemangioma of skin and subcutaneous tissue: Secondary | ICD-10-CM | POA: Diagnosis not present

## 2023-07-09 DIAGNOSIS — D1730 Benign lipomatous neoplasm of skin and subcutaneous tissue of unspecified sites: Secondary | ICD-10-CM | POA: Diagnosis not present

## 2023-08-05 DIAGNOSIS — Z23 Encounter for immunization: Secondary | ICD-10-CM | POA: Diagnosis not present

## 2023-08-19 DIAGNOSIS — Z23 Encounter for immunization: Secondary | ICD-10-CM | POA: Diagnosis not present

## 2023-12-16 DIAGNOSIS — H532 Diplopia: Secondary | ICD-10-CM | POA: Diagnosis not present

## 2023-12-16 DIAGNOSIS — H26491 Other secondary cataract, right eye: Secondary | ICD-10-CM | POA: Diagnosis not present

## 2023-12-16 DIAGNOSIS — H5211 Myopia, right eye: Secondary | ICD-10-CM | POA: Diagnosis not present

## 2024-03-04 DIAGNOSIS — R7301 Impaired fasting glucose: Secondary | ICD-10-CM | POA: Diagnosis not present

## 2024-03-04 DIAGNOSIS — E039 Hypothyroidism, unspecified: Secondary | ICD-10-CM | POA: Diagnosis not present

## 2024-03-04 DIAGNOSIS — E7800 Pure hypercholesterolemia, unspecified: Secondary | ICD-10-CM | POA: Diagnosis not present

## 2024-03-04 DIAGNOSIS — Z1231 Encounter for screening mammogram for malignant neoplasm of breast: Secondary | ICD-10-CM | POA: Diagnosis not present

## 2024-03-05 DIAGNOSIS — Z6824 Body mass index (BMI) 24.0-24.9, adult: Secondary | ICD-10-CM | POA: Diagnosis not present

## 2024-03-05 DIAGNOSIS — H6123 Impacted cerumen, bilateral: Secondary | ICD-10-CM | POA: Diagnosis not present

## 2024-03-05 DIAGNOSIS — M8580 Other specified disorders of bone density and structure, unspecified site: Secondary | ICD-10-CM | POA: Diagnosis not present

## 2024-03-05 DIAGNOSIS — E039 Hypothyroidism, unspecified: Secondary | ICD-10-CM | POA: Diagnosis not present

## 2024-03-05 DIAGNOSIS — R7301 Impaired fasting glucose: Secondary | ICD-10-CM | POA: Diagnosis not present

## 2024-03-05 DIAGNOSIS — F329 Major depressive disorder, single episode, unspecified: Secondary | ICD-10-CM | POA: Diagnosis not present

## 2024-03-05 DIAGNOSIS — E7800 Pure hypercholesterolemia, unspecified: Secondary | ICD-10-CM | POA: Diagnosis not present

## 2024-03-05 DIAGNOSIS — D751 Secondary polycythemia: Secondary | ICD-10-CM | POA: Diagnosis not present

## 2024-03-05 DIAGNOSIS — Z0000 Encounter for general adult medical examination without abnormal findings: Secondary | ICD-10-CM | POA: Diagnosis not present

## 2024-03-05 DIAGNOSIS — I10 Essential (primary) hypertension: Secondary | ICD-10-CM | POA: Diagnosis not present

## 2024-04-24 DIAGNOSIS — M8588 Other specified disorders of bone density and structure, other site: Secondary | ICD-10-CM | POA: Diagnosis not present

## 2024-04-24 DIAGNOSIS — N958 Other specified menopausal and perimenopausal disorders: Secondary | ICD-10-CM | POA: Diagnosis not present

## 2024-07-15 DIAGNOSIS — Z23 Encounter for immunization: Secondary | ICD-10-CM | POA: Diagnosis not present

## 2024-08-20 DIAGNOSIS — E039 Hypothyroidism, unspecified: Secondary | ICD-10-CM | POA: Diagnosis not present

## 2024-08-20 DIAGNOSIS — R7301 Impaired fasting glucose: Secondary | ICD-10-CM | POA: Diagnosis not present

## 2024-08-20 DIAGNOSIS — D751 Secondary polycythemia: Secondary | ICD-10-CM | POA: Diagnosis not present

## 2024-08-20 DIAGNOSIS — I10 Essential (primary) hypertension: Secondary | ICD-10-CM | POA: Diagnosis not present

## 2024-09-03 DIAGNOSIS — D649 Anemia, unspecified: Secondary | ICD-10-CM | POA: Diagnosis not present

## 2024-09-24 DIAGNOSIS — D649 Anemia, unspecified: Secondary | ICD-10-CM | POA: Diagnosis not present

## 2024-11-12 ENCOUNTER — Inpatient Hospital Stay: Admitting: Physician Assistant

## 2024-11-12 ENCOUNTER — Inpatient Hospital Stay

## 2024-11-12 VITALS — BP 175/78 | HR 79 | Temp 97.5°F | Resp 16 | Ht 66.0 in | Wt 156.0 lb

## 2024-11-12 DIAGNOSIS — D508 Other iron deficiency anemias: Secondary | ICD-10-CM

## 2024-11-12 DIAGNOSIS — D509 Iron deficiency anemia, unspecified: Secondary | ICD-10-CM | POA: Insufficient documentation

## 2024-11-12 DIAGNOSIS — D751 Secondary polycythemia: Secondary | ICD-10-CM | POA: Diagnosis not present

## 2024-11-12 DIAGNOSIS — Z862 Personal history of diseases of the blood and blood-forming organs and certain disorders involving the immune mechanism: Secondary | ICD-10-CM

## 2024-11-12 LAB — CBC WITH DIFFERENTIAL (CANCER CENTER ONLY)
Abs Immature Granulocytes: 0.01 10*3/uL (ref 0.00–0.07)
Basophils Absolute: 0.1 10*3/uL (ref 0.0–0.1)
Basophils Relative: 2 %
Eosinophils Absolute: 0.2 10*3/uL (ref 0.0–0.5)
Eosinophils Relative: 2 %
HCT: 43.7 % (ref 36.0–46.0)
Hemoglobin: 13.2 g/dL (ref 12.0–15.0)
Immature Granulocytes: 0 %
Lymphocytes Relative: 32 %
Lymphs Abs: 2.4 10*3/uL (ref 0.7–4.0)
MCH: 22.4 pg — ABNORMAL LOW (ref 26.0–34.0)
MCHC: 30.2 g/dL (ref 30.0–36.0)
MCV: 74.1 fL — ABNORMAL LOW (ref 80.0–100.0)
Monocytes Absolute: 0.8 10*3/uL (ref 0.1–1.0)
Monocytes Relative: 11 %
Neutro Abs: 4 10*3/uL (ref 1.7–7.7)
Neutrophils Relative %: 53 %
Platelet Count: 364 10*3/uL (ref 150–400)
RBC: 5.9 MIL/uL — ABNORMAL HIGH (ref 3.87–5.11)
RDW: 20.2 % — ABNORMAL HIGH (ref 11.5–15.5)
WBC Count: 7.5 10*3/uL (ref 4.0–10.5)
nRBC: 0 % (ref 0.0–0.2)

## 2024-11-12 LAB — CMP (CANCER CENTER ONLY)
ALT: 16 U/L (ref 0–44)
AST: 24 U/L (ref 15–41)
Albumin: 4.7 g/dL (ref 3.5–5.0)
Alkaline Phosphatase: 107 U/L (ref 38–126)
Anion gap: 12 (ref 5–15)
BUN: 13 mg/dL (ref 8–23)
CO2: 24 mmol/L (ref 22–32)
Calcium: 9.7 mg/dL (ref 8.9–10.3)
Chloride: 104 mmol/L (ref 98–111)
Creatinine: 0.68 mg/dL (ref 0.44–1.00)
GFR, Estimated: >60 mL/min
Glucose, Bld: 106 mg/dL — ABNORMAL HIGH (ref 70–99)
Potassium: 4.2 mmol/L (ref 3.5–5.1)
Sodium: 140 mmol/L (ref 135–145)
Total Bilirubin: 0.9 mg/dL (ref 0.0–1.2)
Total Protein: 7.3 g/dL (ref 6.5–8.1)

## 2024-11-12 LAB — IRON AND IRON BINDING CAPACITY (CC-WL,HP ONLY)
Iron: 59 ug/dL (ref 28–170)
Saturation Ratios: 14 % (ref 10.4–31.8)
TIBC: 412 ug/dL (ref 250–450)
UIBC: 353 ug/dL

## 2024-11-12 LAB — RETIC PANEL
Immature Retic Fract: 17.4 % — ABNORMAL HIGH (ref 2.3–15.9)
RBC.: 5.8 MIL/uL — ABNORMAL HIGH (ref 3.87–5.11)
Retic Count, Absolute: 73.7 10*3/uL (ref 19.0–186.0)
Retic Ct Pct: 1.3 % (ref 0.4–3.1)
Reticulocyte Hemoglobin: 24.4 pg — ABNORMAL LOW

## 2024-11-12 LAB — FERRITIN: Ferritin: 10 ng/mL — ABNORMAL LOW (ref 11–307)

## 2024-11-12 NOTE — Progress Notes (Signed)
 " [facility] [phone]   [phone]  INITIAL CONSULT NOTE  Patient Care Team: Katina Pfeiffer, PA-C as PCP - General (Family Medicine) Porter Andrez SAUNDERS, PA-C (Inactive) as Physician Assistant (Dermatology)  Hematological/Oncological History 08/20/2024: WBC 14.0 (H), Hgb 10.7 (L), MCV 67.8 (L), Plt 300, 09/03/2024: WBC 7.7, Hgb 11.4 (L), MCV 69.0 (L), Plt 298,  09/24/2024: WBC 5.8, Hgb 11.9 (L), MCV 69.3 (L), Plt 306K, ferritin 4.2 (L), Iron 46 (L) 11/12/2024: Establish care with [facility]  CHIEF COMPLAINTS/PURPOSE OF CONSULTATION:  Microcytic anemia  Iron deficiency H/O secondary polycythemia (without JAK2/CALR/MPL mutations)  HISTORY OF PRESENTING ILLNESS:  Brandi Mcconnell 79 y.o. female with medical history significant for anxiety, arthritis and hypertension presents to the hematology clinic for iron deficiency anemia. She is unaccompanied for this visit.   On exam today, Brandi Mcconnell reports that she felt more run down but is able to complete all her ADLs on her own. She has some shortness of breath with exertion but none at rest. Her appetite and diet are unchanged. She denies nausea, vomiting or abdominal pain. She has noticed some indigestion like symptoms recently and takes pepcid as needed. She has chronic constipation requiring her taking probiotics and stool softeners as needed. She is craving ice chips and crackers throughout the day. She denies fevers, chills, sweats, chest pain, cough, headaches or dizziness. Rest of the ROS is below.   MEDICAL HISTORY:  Past Medical History:  Diagnosis Date   Anxiety    Arthritis    Hypertension     SURGICAL HISTORY: No past surgical history on file.  SOCIAL HISTORY: Social History   Socioeconomic History   Marital status: Married    Spouse name: Christopher   Number of children: Not on file   Years of education: Not on file   Highest education level: Not on file  Occupational History   Not  on file  Tobacco Use   Smoking status: Never   Smokeless tobacco: Never  Substance and Sexual Activity   Alcohol use: Not Currently   Drug use: Never   Sexual activity: Not Currently  Other Topics Concern   Not on file  Social History Narrative   Not on file   Social Drivers of Health   Tobacco Use: Low Risk (03/26/2022)   Patient History    Smoking Tobacco Use: Never    Smokeless Tobacco Use: Never    Passive Exposure: Not on file  Financial Resource Strain: Not on file  Food Insecurity: No Food Insecurity (11/12/2024)   ACO Reach    Worried About Running Out of Food in the Last Year: No    Ran Out of Food in the Last Year: No  Transportation Needs: No Transportation Needs (11/12/2024)   ACO Reach    Lack of Transportation: No  Physical Activity: Not on file  Stress: Not on file  Social Connections: Not on file  Intimate Partner Violence: Not At Risk (11/12/2024)   ACO Reach    Feels Physically and Emotionally Safe: Yes    Physically Hurt by Someone: No    Humiliated or Emotionally Abused by Someone: No  Depression (PHQ2-9): Not on file  Alcohol Screen: Not on file  Housing: Not At Risk (11/12/2024)   ACO Reach    Has Housing: Yes    Worried About Losing Housing: No    Unable to Get Utilities: No  Utilities: Not At Risk (11/12/2024)   ACO Reach    Has Housing: Yes  Worried About Losing Housing: No    Unable to Get Utilities: No  Health Literacy: Not on file    FAMILY HISTORY: No family history on file.  ALLERGIES:  has no known allergies.  MEDICATIONS:  Current Outpatient Medications  Medication Sig Dispense Refill   amLODipine (NORVASC) 5 MG tablet Take 1 tablet by mouth daily.     aspirin EC 81 MG tablet Take 81 mg by mouth daily.     Cholecalciferol (VITAMIN D) 2000 units CAPS Take 1 capsule by mouth daily.     escitalopram (LEXAPRO) 10 MG tablet Take 1 tablet by mouth daily.     hydrOXYzine (ATARAX/VISTARIL) 10 MG tablet Take 1 tablet by mouth as  directed.     levothyroxine (SYNTHROID, LEVOTHROID) 50 MCG tablet Take 1 tablet by mouth daily.     MELATONIN PO Take by mouth at bedtime.     Probiotic Product (ALIGN PO) Take by mouth as needed.     simvastatin (ZOCOR) 20 MG tablet Take 1 tablet by mouth daily.     No current facility-administered medications for this visit.    REVIEW OF SYSTEMS:   Constitutional: ( - ) fevers, ( - )  chills , ( - ) night sweats Eyes: ( - ) blurriness of vision, ( - ) double vision, ( - ) watery eyes Ears, nose, mouth, throat, and face: ( - ) mucositis, ( - ) sore throat Respiratory: ( - ) cough, ( - ) dyspnea, ( - ) wheezes Cardiovascular: ( - ) palpitation, ( - ) chest discomfort, ( - ) lower extremity swelling Gastrointestinal:  ( - ) nausea, ( - ) heartburn, ( - ) change in bowel habits Skin: ( - ) abnormal skin rashes Lymphatics: ( - ) new lymphadenopathy, ( - ) easy bruising Neurological: ( - ) numbness, ( - ) tingling, ( - ) new weaknesses Behavioral/Psych: ( - ) mood change, ( - ) new changes  All other systems were reviewed with the patient and are negative.  PHYSICAL EXAMINATION: ECOG PERFORMANCE STATUS: 1 - Symptomatic but completely ambulatory  Vitals:   11/12/24 1053  BP: (!) 175/78  Pulse: 79  Resp: 16  Temp: (!) 97.5 F (36.4 C)  SpO2: 99%   Filed Weights   11/12/24 1053  Weight: 156 lb (70.8 kg)    GENERAL: well appearing female in NAD  SKIN: skin color, texture, turgor are normal, no rashes or significant lesions EYES: conjunctiva are pink and non-injected, sclera clear OROPHARYNX: no exudate, no erythema; lips, buccal mucosa, and tongue normal  LUNGS: clear to auscultation and percussion with normal breathing effort HEART: regular rate & rhythm and no murmurs and no lower extremity edema Musculoskeletal: no cyanosis of digits and no clubbing  PSYCH: alert & oriented x 3, fluent speech NEURO: no focal motor/sensory deficits  LABORATORY DATA:  I have reviewed the  data as listed    Latest Ref Rng & Units 05/23/2017    9:18 AM 05/01/2011    9:04 AM 11/08/2010    9:08 AM  CBC  WBC 3.9 - 10.3 10e3/uL 6.3  6.0  5.2   Hemoglobin 11.6 - 15.9 g/dL 83.2  84.1  84.4   Hematocrit 34.8 - 46.6 % 49.4  45.5  45.5   Platelets 145 - 400 10e3/uL 213  195  182        Latest Ref Rng & Units 09/29/2010   10:37 AM  CMP  Glucose 70 - 99 mg/dL 94  BUN 6 - 23 mg/dL 13   Creatinine 9.59 - 1.20 mg/dL 9.24   Sodium 864 - 854 mEq/L 143   Potassium 3.5 - 5.3 mEq/L 4.4   Chloride 96 - 112 mEq/L 104   CO2 19 - 32 mEq/L 28   Calcium 8.4 - 10.5 mg/dL 89.5   Total Protein 6.0 - 8.3 g/dL 7.2   Total Bilirubin 0.3 - 1.2 mg/dL 1.1   Alkaline Phos 39 - 117 U/L 87   AST 0 - 37 U/L 27   ALT 0 - 35 U/L 20     ASSESSMENT & PLAN Brandi Mcconnell is a 79 y.o. female who presents to the clinic for evaluation of iron deficiency anemia.   #Iron deficiency anemia: --Underlying causes include frequent blood donations (3 per year). Recommend follow up with GI to rule out other causes including malabsorption and GI blood loss.  --Will try to avoid PO iron therapy due to chronic history of constipation --Labs today to check CBC, CMP, retic panel, erythropoietin , ferritin, iron and TIBC --Consider IV monoferric to bolster iron levels --RTC in 8 weeks with labs and follow up.   #H/O of polycythemia: --Prior workup ruled out driver mutations with no evidence of JAK2/MPL/CALR mutations --patient is a non smoker and does not use any testosterone containing products --If there is recurrent polycythemia, will order BCR/ABL FISH and recommend sleep study.  Orders Placed This Encounter  Procedures   CBC with Differential (Cancer Center Only)    Standing Status:   Future    Number of Occurrences:   1    Expiration Date:   11/12/2025   CMP (Cancer Center only)    Standing Status:   Future    Number of Occurrences:   1    Expiration Date:   11/12/2025   Ferritin    Standing Status:   Future     Number of Occurrences:   1    Expiration Date:   11/12/2025   Iron and Iron Binding Capacity ([facility] only)    Standing Status:   Future    Number of Occurrences:   1    Expiration Date:   11/12/2025   Retic Panel    Standing Status:   Future    Number of Occurrences:   1    Expiration Date:   11/12/2025   Erythropoietin     Standing Status:   Future    Number of Occurrences:   1    Expiration Date:   11/12/2025    All questions were answered. The patient knows to call the clinic with any problems, questions or concerns.  I have spent a total of 60 minutes minutes of face-to-face and non-face-to-face time, preparing to see the patient, obtaining and/or reviewing separately obtained history, performing a medically appropriate examination, counseling and educating the patient, ordering tests/procedures, referring and communicating with other health care professionals, documenting clinical information in the electronic health record, independently interpreting results and communicating results to the patient, and care coordination.   Johnston Police, PA-C Department of Hematology/Oncology [facility] at [facility] Phone: [phone]  I have read the above note and personally examined the patient. I agree with the assessment and plan as noted above.  Briefly Brandi Mcconnell is a 79 year old female with an interesting situation.  She was previously followed for polycythemia was found to be JAK2 negative.  The rest of her MPN workup was negative.  She presents today with a microcytosis and iron  deficiency.  Today we discussed her findings and recommendations moving forward.  We discussed that boosting her iron levels could potentially result in her becoming polycythemic again.  That would prompt us  to do further workup to include OSA testing.  The etiology of the patient's iron deficiency is unclear.  She is having classic symptoms of iron deficiency with fatigue and ice  cravings.  At this time would recommend pursuing IV iron therapy in the event she was found to be iron deficient on her labs.  We would need to monitor closely for her becoming polycythemic.  Patient voiced understanding of our findings and plan moving forward.    Brandi IVAR Kidney, MD Department of Hematology/Oncology [facility] at [facility] Phone: [phone] Pager: [phone] Email: [email or web address]  "

## 2024-11-13 ENCOUNTER — Inpatient Hospital Stay

## 2024-11-13 ENCOUNTER — Other Ambulatory Visit: Payer: Self-pay

## 2024-11-13 ENCOUNTER — Inpatient Hospital Stay: Admitting: Physician Assistant

## 2024-11-13 ENCOUNTER — Telehealth: Payer: Self-pay

## 2024-11-13 DIAGNOSIS — D508 Other iron deficiency anemias: Secondary | ICD-10-CM

## 2024-11-13 LAB — ERYTHROPOIETIN: Erythropoietin: 36.1 m[IU]/mL — ABNORMAL HIGH (ref 2.6–18.5)

## 2024-11-13 NOTE — Telephone Encounter (Signed)
 11/12/24 office note and lab results faxed to [facility]/Dr Buccini with a note requesting an appt to rule out other causes for IDA such as GI blood loss or malabsorption.  Confirmation received.  Fax request sent for last office note, labs etc from last appt 4 years ago Confirmation received

## 2024-11-17 ENCOUNTER — Telehealth: Payer: Self-pay | Admitting: Physician Assistant

## 2025-01-08 ENCOUNTER — Inpatient Hospital Stay: Admitting: Physician Assistant

## 2025-01-08 ENCOUNTER — Inpatient Hospital Stay
